# Patient Record
Sex: Female | Born: 1995 | Race: White | Hispanic: No | Marital: Married | Smoking: Never smoker
Health system: Southern US, Community
[De-identification: ages and names within clinical notes are randomized; demographics above are authoritative.]

## PROBLEM LIST (undated history)

## (undated) DIAGNOSIS — Z789 Other specified health status: Secondary | ICD-10-CM

## (undated) HISTORY — PX: OTHER SURGICAL HISTORY: SHX169

## (undated) HISTORY — DX: Other specified health status: Z78.9

## (undated) HISTORY — PX: FOOT SURGERY: SHX648

---
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
## 2019-11-01 LAB — OB RESULTS CONSOLE RUBELLA ANTIBODY, IGM: Rubella: IMMUNE

## 2019-11-01 LAB — OB RESULTS CONSOLE RPR: RPR: NONREACTIVE

## 2019-11-01 LAB — OB RESULTS CONSOLE HEPATITIS B SURFACE ANTIGEN: Hepatitis B Surface Ag: NEGATIVE

## 2019-11-02 LAB — OB RESULTS CONSOLE GC/CHLAMYDIA
Chlamydia: NEGATIVE
Gonorrhea: NEGATIVE

## 2020-01-26 LAB — OB RESULTS CONSOLE HIV ANTIBODY (ROUTINE TESTING): HIV: NONREACTIVE

## 2020-03-31 NOTE — L&D Delivery Note (Signed)
Delivery Note Labor onset: 05/09/2020  Labor Onset Time: 1100 Complete dilation at 8:56 PM  Onset of pushing at 2110 FHR second stage Cat 1 Analgesia/Anesthesia intrapartum: Epidural  Guided pushing with maternal urge. Delivery of a viable female at 2155. Fetal head delivered in ROA position.  Nuchal cord: None.  Infant placed on maternal abd, dried, and tactile stim.  Cord double clamped after pulsation stopped and cut by Sam, spouse.  Sam present for birth.  Cord blood sample collected: Yes Arterial cord blood sample collected: No  Placenta delivered Tomasa Blase, intact, with 3 VC.  Placenta to pathology. Uterine tone firm, bleeding small, no clots  2nd degree laceration identified.  Anesthesia: Epidural Repair: 4-0 Vicryl, 2-0 Vicryl EBL (mL): 275 Complications: none APGAR: APGAR (1 MIN): 8   APGAR (5 MINS): 9   APGAR (10 MINS):   Mom to postpartum.  Baby to Couplet care / Skin to Skin.  Roma Schanz MSN, [name] 05/09/2020, 10:41 PM

## 2020-04-03 LAB — OB RESULTS CONSOLE GBS: GBS: POSITIVE

## 2020-05-02 ENCOUNTER — Other Ambulatory Visit: Payer: Self-pay | Admitting: Obstetrics and Gynecology

## 2020-05-03 ENCOUNTER — Encounter (HOSPITAL_COMMUNITY): Payer: Self-pay | Admitting: *Deleted

## 2020-05-03 ENCOUNTER — Telehealth (HOSPITAL_COMMUNITY): Payer: Self-pay | Admitting: *Deleted

## 2020-05-03 NOTE — Telephone Encounter (Signed)
Preadmission screen  

## 2020-05-07 ENCOUNTER — Other Ambulatory Visit (HOSPITAL_COMMUNITY): Payer: 59

## 2020-05-09 ENCOUNTER — Inpatient Hospital Stay (HOSPITAL_COMMUNITY): Payer: 59 | Admitting: Anesthesiology

## 2020-05-09 ENCOUNTER — Encounter (HOSPITAL_COMMUNITY): Payer: Self-pay | Admitting: Obstetrics and Gynecology

## 2020-05-09 ENCOUNTER — Inpatient Hospital Stay (HOSPITAL_COMMUNITY): Payer: 59

## 2020-05-09 ENCOUNTER — Inpatient Hospital Stay (HOSPITAL_COMMUNITY)
Admission: AD | Admit: 2020-05-09 | Discharge: 2020-05-11 | DRG: 807 | Disposition: A | Discharge status: Home / Self Care | Payer: 59 | Attending: Obstetrics & Gynecology | Admitting: Obstetrics & Gynecology

## 2020-05-09 DIAGNOSIS — O99824 Streptococcus B carrier state complicating childbirth: Secondary | ICD-10-CM | POA: Diagnosis present

## 2020-05-09 DIAGNOSIS — Z3A41 41 weeks gestation of pregnancy: Secondary | ICD-10-CM

## 2020-05-09 DIAGNOSIS — Z20822 Contact with and (suspected) exposure to covid-19: Secondary | ICD-10-CM | POA: Diagnosis present

## 2020-05-09 DIAGNOSIS — Z3490 Encounter for supervision of normal pregnancy, unspecified, unspecified trimester: Secondary | ICD-10-CM | POA: Diagnosis present

## 2020-05-09 DIAGNOSIS — O480 Post-term pregnancy: Principal | ICD-10-CM | POA: Diagnosis present

## 2020-05-09 LAB — CBC
HCT: 34.1 % — ABNORMAL LOW (ref 36.0–46.0)
Hemoglobin: 11.2 g/dL — ABNORMAL LOW (ref 12.0–15.0)
MCH: 26.2 pg (ref 26.0–34.0)
MCHC: 32.8 g/dL (ref 30.0–36.0)
MCV: 79.9 fL — ABNORMAL LOW (ref 80.0–100.0)
Platelets: 304 10*3/uL (ref 150–400)
RBC: 4.27 MIL/uL (ref 3.87–5.11)
RDW: 14.9 % (ref 11.5–15.5)
WBC: 10.1 10*3/uL (ref 4.0–10.5)
nRBC: 0 % (ref 0.0–0.2)

## 2020-05-09 LAB — TYPE AND SCREEN
ABO/RH(D): A POS
Antibody Screen: NEGATIVE

## 2020-05-09 LAB — RESP PANEL BY RT-PCR (FLU A&B, COVID) ARPGX2
Influenza A by PCR: NEGATIVE
Influenza B by PCR: NEGATIVE
SARS Coronavirus 2 by RT PCR: NEGATIVE

## 2020-05-09 LAB — RPR: RPR Ser Ql: NONREACTIVE

## 2020-05-09 MED ORDER — SOD CITRATE-CITRIC ACID 500-334 MG/5ML PO SOLN: 30.0000 mL | ORAL | Status: DC | PRN

## 2020-05-09 MED ORDER — MISOPROSTOL 25 MCG QUARTER TABLET
25.0000 ug | ORAL_TABLET | ORAL | Status: DC | PRN
  Administered 2020-05-09 (×3): 25 ug via VAGINAL
  Filled 2020-05-09 (×3): qty 1

## 2020-05-09 MED ORDER — EPHEDRINE 5 MG/ML INJ: 10.0000 mg | INTRAVENOUS | Status: DC | PRN

## 2020-05-09 MED ORDER — PHENYLEPHRINE 40 MCG/ML (10ML) SYRINGE FOR IV PUSH (FOR BLOOD PRESSURE SUPPORT): 80.0000 ug | PREFILLED_SYRINGE | INTRAVENOUS | Status: DC | PRN

## 2020-05-09 MED ORDER — FENTANYL-BUPIVACAINE-NACL 0.5-0.125-0.9 MG/250ML-% EP SOLN
12.0000 mL/h | EPIDURAL | Status: DC | PRN
Start: 2020-05-09 — End: 2020-05-09
  Administered 2020-05-09 (×2): 12 mL/h via EPIDURAL
  Filled 2020-05-09: qty 250

## 2020-05-09 MED ORDER — PENICILLIN G POT IN DEXTROSE 60000 UNIT/ML IV SOLN
3.0000 10*6.[IU] | INTRAVENOUS | Status: DC
  Administered 2020-05-09 (×4): 3 10*6.[IU] via INTRAVENOUS
  Filled 2020-05-09 (×4): qty 50

## 2020-05-09 MED ORDER — OXYTOCIN-SODIUM CHLORIDE 30-0.9 UT/500ML-% IV SOLN
2.5000 [IU]/h | INTRAVENOUS | Status: DC
  Administered 2020-05-09: 2.5 [IU]/h via INTRAVENOUS
  Filled 2020-05-09: qty 500

## 2020-05-09 MED ORDER — TERBUTALINE SULFATE 1 MG/ML IJ SOLN: 0.2500 mg | Freq: Once | INTRAMUSCULAR | Status: DC | PRN

## 2020-05-09 MED ORDER — BUPIVACAINE HCL (PF) 0.25 % IJ SOLN
INTRAMUSCULAR | Status: DC | PRN
  Administered 2020-05-09: 10 mL via EPIDURAL

## 2020-05-09 MED ORDER — DIPHENHYDRAMINE HCL 50 MG/ML IJ SOLN: 12.5000 mg | INTRAMUSCULAR | Status: DC | PRN

## 2020-05-09 MED ORDER — ONDANSETRON HCL 4 MG/2ML IJ SOLN: 4.0000 mg | Freq: Four times a day (QID) | INTRAMUSCULAR | Status: DC | PRN

## 2020-05-09 MED ORDER — OXYTOCIN BOLUS FROM INFUSION
333.0000 mL | Freq: Once | INTRAVENOUS | Status: AC
  Administered 2020-05-09: 333 mL via INTRAVENOUS

## 2020-05-09 MED ORDER — LIDOCAINE HCL (PF) 1 % IJ SOLN: 30.0000 mL | INTRAMUSCULAR | Status: DC | PRN

## 2020-05-09 MED ORDER — ACETAMINOPHEN 325 MG PO TABS: 650.0000 mg | ORAL_TABLET | ORAL | Status: DC | PRN

## 2020-05-09 MED ORDER — SODIUM CHLORIDE 0.9 % IV SOLN
5.0000 10*6.[IU] | Freq: Once | INTRAVENOUS | Status: AC
  Administered 2020-05-09: 5 10*6.[IU] via INTRAVENOUS
  Filled 2020-05-09: qty 5

## 2020-05-09 MED ORDER — LACTATED RINGERS IV SOLN: INTRAVENOUS | Status: DC

## 2020-05-09 MED ORDER — LACTATED RINGERS IV SOLN: 500.0000 mL | INTRAVENOUS | Status: DC | PRN

## 2020-05-09 MED ORDER — LACTATED RINGERS IV SOLN: 500.0000 mL | Freq: Once | INTRAVENOUS | Status: DC

## 2020-05-09 MED ORDER — FENTANYL CITRATE (PF) 100 MCG/2ML IJ SOLN
50.0000 ug | INTRAMUSCULAR | Status: DC | PRN
  Administered 2020-05-09: 100 ug via INTRAVENOUS
  Filled 2020-05-09: qty 2

## 2020-05-09 MED ORDER — LIDOCAINE HCL (PF) 1 % IJ SOLN
INTRAMUSCULAR | Status: DC | PRN
  Administered 2020-05-09: 11 mL via EPIDURAL

## 2020-05-09 NOTE — Progress Notes (Signed)
Subjective:    Reviewed expectations of induction and answered questions.   Objective:    VS: BP 127/89   Pulse 90   Temp 98 F (36.7 C) (Oral)   Resp 16   Ht 5\' 9"  (1.753 m)   Wt 98 kg   BMI 31.90 kg/m  FHR : baseline 135 / variability moderate / accelerations present / absent decelerations Toco: contractions every 3-5 minutes  Membranes: intact Dilation: 1 Effacement (%): 60,70 Station: -3 Presentation: Vertex Exam by:: [email or web address], [name]  Assessment/Plan:   25 y.o. G1P0 [redacted]w[redacted]d  Labor: Cytotec x2, will repeat @ 0915 if needed Fetal Wellbeing:  Category I Pain Control:  plans epidural I/D:  GBS pos PCN x2 doses Anticipated MOD:  NSVD  [name], [name] 05/09/2020 8:14 AM

## 2020-05-09 NOTE — H&P (Signed)
OB ADMISSION/ HISTORY & PHYSICAL:  Admission Date: 05/09/2020 12:19 AM  Admit Diagnosis: Induction of labor for postdates @ 41 wks  Stephanie Cooley is a 25 y.o. female G1P0 [redacted]w[redacted]d presenting for IOL for postdates. Endorses active FM, denies, ctx, LOF and vaginal bleeding.   History of current pregnancy: G1P0   Primary OB Provider: [facility] Patient entered care with [facility] at 13.6 wks.   EDC 05/02/20 by LMP and congruent w/ 13.6 wk U/S.   Anatomy scan: 21 wks, complete w/ anterior placenta.   Last evaluation: 40.6 wks  Significant prenatal events:  Patient Active Problem List   Diagnosis Date Noted  . Post-dates pregnancy 05/09/2020  . Encounter for induction of labor 05/09/2020    Prenatal Labs: ABO, Rh: A POS Antibody: Negative Rubella: Immune (08/03 0000)  RPR:   NR HBsAg: Negative (08/03 0000)  HIV: Non-reactive (10/28 0000)  GTT: 88 GBS: Positive/-- (01/04 0000)  GC/CHL: Negative/Negative Genetics: Panorama low risk female Tdap/influenza vaccines: UPT/Declined   OB History  Gravida Para Term Preterm AB Living  1            SAB IAB Ectopic Multiple Live Births               # Outcome Date GA Lbr Len/2nd Weight Sex Delivery Anes PTL Lv  1 Current             Medical / Surgical History: Past medical history:  Past Medical History:  Diagnosis Date  . Medical history non-contributory     Past surgical history:  Past Surgical History:  Procedure Laterality Date  . FOOT SURGERY    . widsom teeth     Family History: No family history on file.  Social History:  reports that she has never smoked. She has never used smokeless tobacco. She reports previous alcohol use. She reports that she does not use drugs.  Allergies: Patient has no known allergies.   Current Medications at time of admission:  Prior to Admission medications   Not on File    Review of Systems: Constitutional: Negative   HENT: Negative   Eyes: Negative   Respiratory: Negative    Cardiovascular: Negative   Gastrointestinal: Negative  Genitourinary: negativefor bloody show, negative for LOF   Musculoskeletal: Negative   Skin: Negative   Neurological: Negative   Endo/Heme/Allergies: Negative   Psychiatric/Behavioral: Negative    Physical Exam: VS: Blood pressure 140/77, pulse 100, resp. rate 16, height 5\' 9"  (1.753 m), weight (!) 2.205 kg. AAO x3, no signs of distress Cardiovascular: RRR Respiratory: Lung fields clear to ausculation GU/GI: Abdomen gravid, non-tender, non-distended, active FM, vertex,  Extremities: negative edema, negative for pain, tenderness, and cords  Cervical exam:Dilation: 1 Effacement (%): 50,60 Station: -3 Exam by:: [email or web address], RN FHR: baseline rate 135 / variability moderate / accelerations present / absent decelerations TOCO: occasional   Prenatal Transfer Tool  Maternal Diabetes: No Genetic Screening: Normal Maternal Ultrasounds/Referrals: Normal Fetal Ultrasounds or other Referrals:  None Maternal Substance Abuse:  No Significant Maternal Medications:  None Significant Maternal Lab Results: Group B Strep positive    Assessment: 25 y.o. G1P0 [redacted]w[redacted]d IOL for postdates.   Cytotec x 1  FHR category 1 GBS positive Pain management plan: epidural PRN   Plan:  Admit to L&D Routine admission orders Epidural PRN  Will  Notified Dr [redacted]w[redacted]d  of admission and plan of care  Normand Sloop MSN, [name] 05/09/2020 1:37 AM

## 2020-05-09 NOTE — Anesthesia Preprocedure Evaluation (Signed)
Anesthesia Evaluation  Patient identified by MRN, date of birth, ID band Patient awake    Reviewed: Allergy & Precautions, H&P , NPO status , Patient's Chart, lab work & pertinent test results  Airway Mallampati: II  TM Distance: >3 FB Neck ROM: Full    Dental no notable dental hx.    Pulmonary neg pulmonary ROS,    Pulmonary exam normal breath sounds clear to auscultation       Cardiovascular negative cardio ROS Normal cardiovascular exam Rhythm:Regular Rate:Normal     Neuro/Psych negative neurological ROS  negative psych ROS   GI/Hepatic negative GI ROS, Neg liver ROS,   Endo/Other  negative endocrine ROS  Renal/GU negative Renal ROS  negative genitourinary   Musculoskeletal negative musculoskeletal ROS (+)   Abdominal   Peds negative pediatric ROS (+)  Hematology negative hematology ROS (+)   Anesthesia Other Findings   Reproductive/Obstetrics (+) Pregnancy                             Anesthesia Physical Anesthesia Plan  ASA: II  Anesthesia Plan: Epidural   Post-op Pain Management:    Induction:   PONV Risk Score and Plan:   Airway Management Planned:   Additional Equipment:   Intra-op Plan:   Post-operative Plan:   Informed Consent:   Plan Discussed with:   Anesthesia Plan Comments:         Anesthesia Quick Evaluation  

## 2020-05-09 NOTE — Progress Notes (Signed)
Nicloe Cooley is a 25 y.o. G1P0 at [redacted]w[redacted]d IOL for postdates.  Subjective:  Stephanie Cooley is resting comfortably with husband at bedside. SVE 1/60/-2. CAT 1. Cytotec placed PV.   Objective: Vitals:   05/09/20 0022 05/09/20 0032 05/09/20 0200 05/09/20 0250  BP:  140/77 115/61 127/73  Pulse:  100 80 86  Resp:  16    Temp:  98 F (36.7 C)    TempSrc:  Oral    Weight: 98 kg     Height: 5\' 9"  (1.753 m)       No intake/output data recorded.    FHT:  FHR: 145 bpm, variability: moderate,  accelerations:  Present,  decelerations:  Absent UC:   occasional SVE:   1/60-70/-2  Labs:   Recent Labs    05/09/20 0100  WBC 10.1  HGB 11.2*  HCT 34.1*  PLT 304    Assessment / Plan: [name]@ [redacted]w[redacted]d IOL for postdates  Labor: Progressing normally, cytotec x 2 Preeclampsia:  no signs or symptoms of toxicity Fetal Wellbeing:  Category I Pain Control:  Epidural prn I/D:  GBS positive - PCN  Anticipated MOD:  NSVD   [name], [name] 05/09/2020, 5:27 AM

## 2020-05-09 NOTE — Progress Notes (Signed)
Subjective:    Ctx increased in frequency and pain after Cytotec #3. IV sedation not effective for long term pain management. Now comfortable after epidural.   Objective:    VS: BP 108/61   Pulse 82   Temp 98.2 F (36.8 C) (Oral)   Resp 18   Ht 5\' 9"  (1.753 m)   Wt 98 kg   BMI 31.90 kg/m  FHR : baseline 130 / variability moderate / accelerations present / absent decelerations Toco: contractions every 2 minutes  Membranes: intact Dilation: 3 Effacement (%): 80 Cervical Position: Posterior Station: -2 Presentation: Vertex Exam by:: [place], rn Pitocin none  Assessment/Plan:   25 y.o. G1P0 [redacted]w[redacted]d  Labor: Progressing normally and began to labor after 3rd cytotec, will consider AROM wi decent of vertex. May augment w/ Pitocin if needed Preeclampsia:  N/A Fetal Wellbeing:  Category I Pain Control:  Epidural I/D:  GBS pos, adequate treatment w/ PCN Anticipated MOD:  NSVD  [name], [name] 05/09/2020 2:14 PM

## 2020-05-09 NOTE — Lactation Note (Signed)
This note was copied from a baby's chart. Lactation Consultation Note  Patient Name: Girl Jenie Parish [id number] Date: 05/09/2020 Reason for consult: L&D Initial assessment Age:26 hours  L&D consult with 1 hour old infant and P1 mother. Parents are present at time of consult. Congratulated them on their newborn. Infant is skin to skin prone on father's chest. Discussed STS as ideal transition for infants after birth helping with temperature, blood sugar and comfort. Talked about primal reflexes such as rooting, hands to mouth, searching for the breast among others.   LC assisted with latch and hand expression at this time. Explained LC services availability during postpartum stay. Thanked family for their time.   Maternal Data Has patient been taught Hand Expression?: Yes Does the patient have breastfeeding experience prior to this delivery?: No  Feeding Mother's Current Feeding Choice: Breast Milk and Formula  LATCH Score Latch: Repeated attempts needed to sustain latch, nipple held in mouth throughout feeding, stimulation needed to elicit sucking reflex.  Audible Swallowing: A few with stimulation  Type of Nipple: Everted at rest and after stimulation (short shaft)  Comfort (Breast/Nipple): Soft / non-tender  Hold (Positioning): Assistance needed to correctly position infant at breast and maintain latch.  LATCH Score: 7   Lactation Tools Discussed/Used    Interventions Interventions: Assisted with latch;Skin to skin;Breast feeding basics reviewed  Discharge    Consult Status Consult Status: Follow-up Date: 05/09/20 Follow-up type: In-patient    Linels A Higuera Ancidey 05/09/2020, 10:56 PM

## 2020-05-09 NOTE — Plan of Care (Signed)

## 2020-05-09 NOTE — Anesthesia Procedure Notes (Signed)
Epidural Patient location during procedure: OB Start time: 05/09/2020 1:13 PM End time: 05/09/2020 1:22 PM  Staffing Anesthesiologist: Lowella Curb, MD Performed: anesthesiologist   Preanesthetic Checklist Completed: patient identified, IV checked, site marked, risks and benefits discussed, surgical consent, monitors and equipment checked, pre-op evaluation and timeout performed  Epidural Patient position: sitting Prep: ChloraPrep Patient monitoring: heart rate, cardiac monitor, continuous pulse ox and blood pressure Approach: midline Location: L2-L3 Injection technique: LOR saline  Needle:  Needle type: Tuohy  Needle gauge: 17 G Needle length: 9 cm Needle insertion depth: 5 cm Catheter type: closed end flexible Catheter size: 20 Guage Catheter at skin depth: 9 cm Test dose: negative  Assessment Events: blood not aspirated, injection not painful, no injection resistance, no paresthesia and negative IV test  Additional Notes Epidural placed by SRNA under direct supervisionReason for block:procedure for pain

## 2020-05-10 LAB — CBC
HCT: 32.9 % — ABNORMAL LOW (ref 36.0–46.0)
Hemoglobin: 10.2 g/dL — ABNORMAL LOW (ref 12.0–15.0)
MCH: 25.3 pg — ABNORMAL LOW (ref 26.0–34.0)
MCHC: 31 g/dL (ref 30.0–36.0)
MCV: 81.6 fL (ref 80.0–100.0)
Platelets: 265 10*3/uL (ref 150–400)
RBC: 4.03 MIL/uL (ref 3.87–5.11)
RDW: 15 % (ref 11.5–15.5)
WBC: 15.2 10*3/uL — ABNORMAL HIGH (ref 4.0–10.5)
nRBC: 0 % (ref 0.0–0.2)

## 2020-05-10 MED ORDER — BENZOCAINE-MENTHOL 20-0.5 % EX AERO
1.0000 "application " | INHALATION_SPRAY | CUTANEOUS | Status: DC | PRN
  Administered 2020-05-10: 1 via TOPICAL
  Filled 2020-05-10 (×2): qty 56

## 2020-05-10 MED ORDER — DIBUCAINE (PERIANAL) 1 % EX OINT: 1.0000 "application " | TOPICAL_OINTMENT | CUTANEOUS | Status: DC | PRN

## 2020-05-10 MED ORDER — ONDANSETRON HCL 4 MG PO TABS: 4.0000 mg | ORAL_TABLET | ORAL | Status: DC | PRN

## 2020-05-10 MED ORDER — PRENATAL MULTIVITAMIN CH
1.0000 | ORAL_TABLET | Freq: Every day | ORAL | Status: DC
  Administered 2020-05-10 – 2020-05-11 (×2): 1 via ORAL
  Filled 2020-05-10 (×2): qty 1

## 2020-05-10 MED ORDER — SENNOSIDES-DOCUSATE SODIUM 8.6-50 MG PO TABS
2.0000 | ORAL_TABLET | ORAL | Status: DC
  Administered 2020-05-10 (×2): 2 via ORAL
  Filled 2020-05-10 (×2): qty 2

## 2020-05-10 MED ORDER — WITCH HAZEL-GLYCERIN EX PADS
1.0000 | MEDICATED_PAD | CUTANEOUS | Status: DC | PRN
Start: 2020-05-09 — End: 2020-05-11

## 2020-05-10 MED ORDER — FAMOTIDINE 20 MG PO TABS: 20.0000 mg | ORAL_TABLET | Freq: Two times a day (BID) | ORAL | Status: DC | PRN

## 2020-05-10 MED ORDER — SIMETHICONE 80 MG PO CHEW: 80.0000 mg | CHEWABLE_TABLET | ORAL | Status: DC | PRN

## 2020-05-10 MED ORDER — ONDANSETRON HCL 4 MG/2ML IJ SOLN: 4.0000 mg | INTRAMUSCULAR | Status: DC | PRN

## 2020-05-10 MED ORDER — COCONUT OIL OIL
1.0000 "application " | TOPICAL_OIL | Status: DC | PRN
  Administered 2020-05-10: 1 via TOPICAL

## 2020-05-10 MED ORDER — TETANUS-DIPHTH-ACELL PERTUSSIS 5-2.5-18.5 LF-MCG/0.5 IM SUSY: 0.5000 mL | PREFILLED_SYRINGE | Freq: Once | INTRAMUSCULAR | Status: DC

## 2020-05-10 MED ORDER — DIPHENHYDRAMINE HCL 25 MG PO CAPS: 25.0000 mg | ORAL_CAPSULE | Freq: Four times a day (QID) | ORAL | Status: DC | PRN

## 2020-05-10 MED ORDER — ACETAMINOPHEN 325 MG PO TABS
650.0000 mg | ORAL_TABLET | ORAL | Status: DC | PRN
  Administered 2020-05-10 – 2020-05-11 (×2): 650 mg via ORAL
  Filled 2020-05-10 (×2): qty 2

## 2020-05-10 MED ORDER — IBUPROFEN 600 MG PO TABS
600.0000 mg | ORAL_TABLET | Freq: Four times a day (QID) | ORAL | Status: DC
  Administered 2020-05-10 – 2020-05-11 (×6): 600 mg via ORAL
  Filled 2020-05-10 (×6): qty 1

## 2020-05-10 NOTE — Lactation Note (Signed)
This note was copied from a baby's chart. Lactation Consultation Note  Patient Name: Stephanie Cooley [id number] Date: 05/10/2020 Reason for consult: Initial assessment;Mother's request;Primapara;1st time breastfeeding;Term;Difficult latch  Age:26 hours  Mom using a NS size 16 last feeding for 10 minutes. LC placed infant STS try to latch but unsuccessful. Mom flat short nipples. LC used 16 NS and infant will latch and suck intermittently.   LC reached out to RN, Wendall Papa, complete a DBM consent and use DM via curve tip syringe to fill 16 NS.  LC returned and per RN infant able to take 8 ml of DBM via curve tip and NS.  LC set up the DEBP.   Plan 1. To feed based on cues 8-12 x in 24 hour period no more than 4 hours without an attempt. Mom to place infant STS and look for signs of milk transfer.          2. Mom to try latch breast first then if needed use 16 NS. Mom knows to call RN or [facility] for assistance.          3. Mom to supplement in curve tip with DBM if needed to initiate a latch.          4. DEBP q 3 hours for 15 minutes.           5. Breastfeeding supplementation guide provided to offer EBM or DBM based on age.   All questions answered at the end of the visit.   Maternal Data Has patient been taught Hand Expression?: Yes Does the patient have breastfeeding experience prior to this delivery?: No  Feeding Mother's Current Feeding Choice: Breast Milk and Donor Milk  LATCH Score Latch: Repeated attempts needed to sustain latch, nipple held in mouth throughout feeding, stimulation needed to elicit sucking reflex.  Audible Swallowing: None  Type of Nipple: Flat (short shafted and small)  Comfort (Breast/Nipple): Soft / non-tender  Hold (Positioning): Assistance needed to correctly position infant at breast and maintain latch.  LATCH Score: 5   Lactation Tools Discussed/Used Tools: Nipple Shields Nipple shield size: 16 Flange Size: 24 Breast pump type:  Double-Electric Breast Pump Pump Education: Setup, frequency, and cleaning;Milk Storage Reason for Pumping: Increase stimulation Pumping frequency: every 3 hours for 15 minutes  Interventions Interventions: Breast feeding basics reviewed;Support pillows;Education;Assisted with latch;Position options;Skin to skin;Expressed milk;Breast massage;Hand express;DEBP;Adjust position;Reverse pressure  Discharge WIC Program: No  Consult Status Consult Status: Follow-up Date: 05/11/20 Follow-up type: In-patient    Stephanie Cooley  Stephanie Cooley 05/10/2020, 1:09 PM

## 2020-05-10 NOTE — Progress Notes (Signed)
Subjective: Postpartum Day # 1 : S/P NSVD due to presented on 2-9 for late term IOL, progressed with Cytotec for induction, with SVD on 2/9 @ 2155, ebl over 2nd degree repaired laceration with hgb drop of 11.2-10.2, had baby female. Patient up ad lib, denies syncope or dizziness. Reports consuming regular diet without issues and denies N/V. Patient reports 0 bowel movement + passing flatus.  Denies issues with urination and reports bleeding is "lighter."  Patient is breastfeeding and reports going well.  Desires pill for postpartum contraception.  Pain is being appropriately managed with use of po meds.   2ns laceration repaired Feeding:  breast Contraceptive plan:  pill Bany Female  Objective: Vital signs in last 24 hours: Patient Vitals for the past 24 hrs:  BP Temp Temp src Pulse Resp SpO2  05/10/20 0525 119/78 98.4 F (36.9 C) - 92 18 98 %  05/10/20 0130 126/85 98.5 F (36.9 C) Axillary 91 17 99 %  05/10/20 0015 128/76 98.3 F (36.8 C) Oral 87 18 99 %  05/09/20 2315 123/74 - - 90 - -  05/09/20 2300 123/75 - - 97 - -  05/09/20 2245 116/67 - - 86 - -  05/09/20 2231 (!) 110/96 - - (!) 131 - -  05/09/20 2216 123/67 - - 96 - -  05/09/20 2100 130/72 - - (!) 104 16 -  05/09/20 1928 - 99.1 F (37.3 C) Oral - 16 -  05/09/20 1906 125/83 - - 94 18 -  05/09/20 1830 126/90 - - 98 18 -  05/09/20 1801 124/78 98.1 F (36.7 C) Oral 83 18 -  05/09/20 1731 (!) 105/58 - - 68 18 -  05/09/20 1701 105/63 - - 79 18 -  05/09/20 1631 (!) 106/53 - - 76 - -  05/09/20 1601 117/63 - - 72 18 -  05/09/20 1531 123/76 - - 72 18 -  05/09/20 1502 119/88 - - 83 18 -  05/09/20 1415 106/66 - - 82 18 -  05/09/20 1411 108/61 - - 82 18 -  05/09/20 1407 106/70 - - 84 - -  05/09/20 1400 104/65 - - 86 - -  05/09/20 1356 107/67 - - (!) 180 - -  05/09/20 1351 103/68 - - 88 - -  05/09/20 1346 104/85 - - 98 - -  05/09/20 1340 108/62 - - 84 - -  05/09/20 1336 103/68 - - 85 - -  05/09/20 1330 111/75 - - 86 - -   05/09/20 1325 111/74 - - 78 - -  05/09/20 1324 112/72 - - 89 - -  05/09/20 1321 (!) 86/44 - - 97 - -  05/09/20 1317 114/69 - - 93 - -  05/09/20 1315 114/74 - - 97 - -  05/09/20 1219 - - - - 18 -  05/09/20 1149 111/69 98.2 F (36.8 C) Oral 79 16 -  05/09/20 1035 - - - - 18 -  05/09/20 0755 127/89 98 F (36.7 C) Oral 90 16 -     Physical Exam:  General: alert, cooperative, appears stated age and no distress Mood/Affect: happy Lungs: clear to auscultation, no wheezes, rales or rhonchi, symmetric air entry.  Heart: normal rate, regular rhythm, normal S1, S2, no murmurs, rubs, clicks or gallops. Breast: breasts appear normal, no suspicious masses, no skin or nipple changes or axillary nodes. Abdomen:  + bowel sounds, soft, non-tender GU: perineum aproximate, healing well. No signs of external hematomas.  Uterine Fundus: firm Lochia: appropriate Skin: Warm, Dry.  DVT Evaluation: No evidence of DVT seen on physical exam. Negative Homan's sign. No cords or calf tenderness. No significant calf/ankle edema.  CBC Latest Ref Rng & Units 05/10/2020 05/09/2020 07/22/2008  WBC 4.0 - 10.5 K/uL 15.2(H) 10.1 -  Hemoglobin 12.0 - 15.0 g/dL 10.2(L) 11.2(L) 13.6  Hematocrit 36.0 - 46.0 % 32.9(L) 34.1(L) 40.0  Platelets 150 - 400 K/uL 265 304 -    Results for orders placed or performed during the hospital encounter of 05/09/20 (from the past 24 hour(s))  CBC     Status: Abnormal   Collection Time: 05/10/20  5:13 AM  Result Value Ref Range   WBC 15.2 (H) 4.0 - 10.5 K/uL   RBC 4.03 3.87 - 5.11 MIL/uL   Hemoglobin 10.2 (L) 12.0 - 15.0 g/dL   HCT 82.9 (L) 56.2 - 13.0 %   MCV 81.6 80.0 - 100.0 fL   MCH 25.3 (L) 26.0 - 34.0 pg   MCHC 31.0 30.0 - 36.0 g/dL   RDW 86.5 78.4 - 69.6 %   Platelets 265 150 - 400 K/uL   nRBC 0.0 0.0 - 0.2 %     CBG (last 3)  No results for input(s): GLUCAP in the last 72 hours.   I/O last 3 completed shifts: In: -  Out: 2875 [Urine:2600; Blood:275]    Assessment Postpartum Day # 1 : S/P NSVD due to presented on 2-9 for late term IOL, progressed with Cytotec for induction, with SVD on 2/9 @ 2155, ebl over 2nd degree repaired laceration with hgb drop of 11.2-10.2, had baby female. Pt stable. -1 involution. breastfeeding. Hemodynamically stable.   Plan: Continue other mgmt as ordered VTE prophylactics: Early ambulated as tolerates.  Pain control: Motrin/Tylenol PRN Education given regarding options for contraception, including barrier methods, injectable contraception, IUD placement, oral contraceptives.  Plan for discharge tomorrow, Breastfeeding and Lactation consult   Dr. Mora Appl to be updated on patient status  [facility] NP-C, [name] 05/10/2020, 7:02 AM

## 2020-05-10 NOTE — Anesthesia Postprocedure Evaluation (Signed)
Anesthesia Post Note  Patient: Stephanie Cooley  Procedure(s) Performed: AN AD HOC LABOR EPIDURAL     Patient location during evaluation: Mother Baby Anesthesia Type: Epidural Level of consciousness: awake and alert Pain management: pain level controlled Vital Signs Assessment: post-procedure vital signs reviewed and stable Respiratory status: spontaneous breathing, nonlabored ventilation and respiratory function stable Cardiovascular status: stable Postop Assessment: no headache, no backache and epidural receding Anesthetic complications: no   No complications documented.  Last Vitals:  Vitals:   05/10/20 0525 05/10/20 0932  BP: 119/78 131/75  Pulse: 92 62  Resp: 18 17  Temp: 36.9 C 36.8 C  SpO2: 98%     Last Pain:  Vitals:   05/10/20 0932  TempSrc: Oral  PainSc:    Pain Goal:                   Seini Lannom

## 2020-05-11 MED ORDER — IBUPROFEN 600 MG PO TABS: 600.0000 mg | ORAL_TABLET | Freq: Four times a day (QID) | ORAL | 0 refills | Status: DC

## 2020-05-11 MED ORDER — ACETAMINOPHEN 325 MG PO TABS: 650.0000 mg | ORAL_TABLET | ORAL | Status: DC | PRN

## 2020-05-11 NOTE — Lactation Note (Addendum)
This note was copied from a baby's chart. Lactation Consultation Note  Patient Name: Stephanie Cooley [id number] Date: 05/11/2020   Age:26 hours  When I entered room, infant was cueing to feed despite having recently fed. I offered to help parents with feeding & they accepted. They had been using a curved-tip syringe to supplement at the breast. I offered to try a 5 Fr./syringe and Mom agreed to try.  I noted that the size 16 nipple shield seemed a little tight after infant had suckled for a few minutes. I moved Mom to a size 24 nipple shield with good results (no size 20 nipple shields were available). Infant was supplemented at the breast with the 5 Fr/syringe. I showed parents how to know if infant is swallowing and how to pace the slow pushing of the plunger with the syringe.   Infant did well with supplementing at the breast & the size 24 NS (infant was fed & content in 10 minutes). Infant could have had a slightly deeper latch while using the size 24 NS so I showed parents the specifics of an asymmetric latch via a [employer] & I showed Mom how to do side-lying nursing (while Dad was holding infant).   I asked Mom to pump 4-6 times/day since she is using a nipple shield; size 21 flanges were provided for her Medela pump at home. I underscored the need to f/u with lactation on an outpatient basis since she is using a nipple shield. A referral was sent to our outpatient clinic.   A different hand expression technique was taught to Mom, which made Mom happy.  A slight divet was noted in tip of infant's tongue when tongue was lying flat over gumline. Good mobility of tongue noted when infant extended her tongue to the R.  Extra NS provided.  Dad was shown how to wash the 5Fr and syringe. An extra 5Fr & 2 extra syringes were provided. Parents have volume guidelines for supplementing at breast or with a bottle & know to supplement until infant is content.  I also spoke to  parents about how to supplement at the breast without a nipple shield (and ways Mom could get her nipple to become more erect before trying to feed at the bare breast).   Sanitizing once/day reviewed for pump parts.   Parents know how to reach [place] for any post-discharge questions.    Lurline Hare [facility] 05/11/2020, 9:18 AM

## 2020-05-11 NOTE — Discharge Summary (Signed)
SVD with 2 degree lac OB Discharge Summary  Patient Name: Stephanie Cooley DOB: [birth year] MRN: [id number]  Date of admission: 05/09/2020 Intrauterine pregnancy: [redacted]w[redacted]d   Admitting diagnosis: Post-dates pregnancy [O48.0] Encounter for induction of labor [Z34.90] Secondary diagnosis: None  Date of discharge: 05/11/2020    Discharge diagnosis: Term Pregnancy Delivered     Prenatal history: G1P1001   EDC : 05/02/2020, by Other Basis  Pren[facility] care at [facility]  Primary provider : [facility]  Prenatal Labs: ABO, Rh: --/--/A POS (02/09 3151) /  Antibody: NEG (02/09 0048) Rubella: Immune (08/03 0000)  /  RPR: NON REACTIVE (02/09 0100)  HBsAg: Negative (08/03 0000)  HIV: Non-reactive (10/28 0000)  GBS: Positive/-- (01/04 0000)                                    Hospital course:  Induction of labor With Vaginal Delivery      25 y.o. yo G1P1001 at [redacted]w[redacted]d was admitted for postdates IOL on 05/09/2020. Patient had an uncomplicated labor course as follows:  Membrane Rupture Time/Date: 9:19 PM ,05/09/2020   Delivery Method:Vaginal, Spontaneous  Episiotomy: None  Lacerations:  2nd degree  Patient had an uncomplicated postpartum course.  She is ambulating, tolerating a regular diet, passing flatus, and urinating well. Patient is discharged home in stable condition on 05/11/20.  Newborn Data: Birth date:05/09/2020  Birth time:9:55 PM  Gender:Female  Living status:Living  Apgars:8 ,9  Weight:3751 g  Delivering PROVIDER: Rhea Pink B                                                            Complications: None  Newborn Data: Live born female  Birth Weight: 8 lb 4.3 oz (3751 g) APGAR: 8, 9  Newborn Delivery   Birth date/time: 05/09/2020 21:55:00 Delivery type: Vaginal, Spontaneous      Baby Feeding: Breast Disposition:home with mother  Post partum procedures: N/A Desires the mini pill for contraception  Labs: Lab Results  Component Value Date   WBC 15.2 (H) 05/10/2020   HGB 10.2 (L)  05/10/2020   HCT 32.9 (L) 05/10/2020   MCV 81.6 05/10/2020   PLT 265 05/10/2020   CMP Latest Ref Rng & Units 07/22/2008  Glucose 70 - 99 mg/dL 87  BUN 6 - 23 mg/dL 15  Creatinine 0.4 - 1.2 mg/dL 0.8  Sodium 761 - 607 mEq/L 141  Potassium 3.5 - 5.1 mEq/L 3.1(L)  Chloride 96 - 112 mEq/L 106    Physical Exam @ time of discharge:  Vitals:   05/10/20 0932 05/10/20 1317 05/10/20 2111 05/11/20 0540  BP: 131/75 116/74 118/79 118/81  Pulse: 62 78 86 85  Resp: 17 18 18 18   Temp: 98.2 F (36.8 C) 97.6 F (36.4 C) 98.7 F (37.1 C) 98 F (36.7 C)  TempSrc: Oral Oral Oral Oral  SpO2:   98%   Weight:      Height:       general: alert, cooperative and no distress lochia: appropriate uterine fundus: firm perineum: intact incision: N/A Extremities:WNL DVT Evaluation: No evidence of DVT seen on physical exam. Negative Homan's sign. No cords or calf tenderness. No significant calf/ankle edema.  Discharge instructions:  "Baby and  Me Booklet" and [facility] Booklet Discharge Medications:  Allergies as of 05/11/2020   No Known Allergies     Medication List    STOP taking these medications   calcium carbonate 500 MG chewable tablet Commonly known as: TUMS - dosed in mg elemental calcium   famotidine-calcium carbonate-magnesium hydroxide 10-800-165 MG chewable tablet Commonly known as: PEPCID COMPLETE     TAKE these medications   acetaminophen 325 MG tablet Commonly known as: Tylenol Take 2 tablets (650 mg total) by mouth every 4 (four) hours as needed (for pain scale < 4). What changed:   when to take this  reasons to take this   ibuprofen 600 MG tablet Commonly known as: ADVIL Take 1 tablet (600 mg total) by mouth every 6 (six) hours.   prenatal multivitamin Tabs tablet Take 1 tablet by mouth at bedtime.      Diet: routine diet Activity: Advance as tolerated. Pelvic rest x 6 weeks.  Follow up:4 weeks  Signed: Carollee Leitz MSN, [name] 05/11/2020, 11:02 AM

## 2020-05-14 LAB — SURGICAL PATHOLOGY

## 2021-03-31 NOTE — L&D Delivery Note (Signed)
Delivery Note At 2:25 PM a viable female was delivered via Vaginal, Spontaneous (Presentation: Left Occiput Anterior).  APGAR: 8, 9; weight  .   Placenta status: Spontaneous, Intact.  Cord: 3 vessels with the following complications: None.  Cord pH: na  Anesthesia: Epidural Episiotomy: None Lacerations: 2nd degree;Perineal Suture Repair: 2.0 chromic Est. Blood Loss (mL): 150  Mom to postpartum.  Baby to Couplet care / Skin to Skin.  Parents asked if FOB could assist with delivery. I allowed him to assist.    Charron Coultas A Sylvan Sookdeo 12/22/2021, 3:26 PM

## 2021-05-13 DIAGNOSIS — N925 Other specified irregular menstruation: Secondary | ICD-10-CM | POA: Diagnosis not present

## 2021-05-13 DIAGNOSIS — Z3A01 Less than 8 weeks gestation of pregnancy: Secondary | ICD-10-CM | POA: Diagnosis not present

## 2021-05-30 DIAGNOSIS — Z3A10 10 weeks gestation of pregnancy: Secondary | ICD-10-CM | POA: Diagnosis not present

## 2021-05-30 DIAGNOSIS — Z3481 Encounter for supervision of other normal pregnancy, first trimester: Secondary | ICD-10-CM | POA: Diagnosis not present

## 2021-05-30 DIAGNOSIS — O3680X9 Pregnancy with inconclusive fetal viability, other fetus: Secondary | ICD-10-CM | POA: Diagnosis not present

## 2021-05-30 DIAGNOSIS — N925 Other specified irregular menstruation: Secondary | ICD-10-CM | POA: Diagnosis not present

## 2021-05-30 LAB — OB RESULTS CONSOLE RPR: RPR: NONREACTIVE

## 2021-05-30 LAB — HEPATITIS C ANTIBODY: HCV Ab: NEGATIVE

## 2021-05-30 LAB — OB RESULTS CONSOLE RUBELLA ANTIBODY, IGM: Rubella: IMMUNE

## 2021-05-30 LAB — OB RESULTS CONSOLE HIV ANTIBODY (ROUTINE TESTING): HIV: NONREACTIVE

## 2021-05-30 LAB — OB RESULTS CONSOLE ABO/RH: RH Type: POSITIVE

## 2021-05-30 LAB — OB RESULTS CONSOLE ANTIBODY SCREEN: Antibody Screen: NEGATIVE

## 2021-05-30 LAB — OB RESULTS CONSOLE GC/CHLAMYDIA
Chlamydia: NEGATIVE
Neisseria Gonorrhea: NEGATIVE

## 2021-05-30 LAB — OB RESULTS CONSOLE HEPATITIS B SURFACE ANTIGEN: Hepatitis B Surface Ag: NEGATIVE

## 2021-08-01 DIAGNOSIS — Z363 Encounter for antenatal screening for malformations: Secondary | ICD-10-CM | POA: Diagnosis not present

## 2021-08-01 DIAGNOSIS — Z3A20 20 weeks gestation of pregnancy: Secondary | ICD-10-CM | POA: Diagnosis not present

## 2021-09-09 ENCOUNTER — Inpatient Hospital Stay (HOSPITAL_COMMUNITY): Payer: 59

## 2021-09-09 ENCOUNTER — Other Ambulatory Visit: Payer: Self-pay

## 2021-09-09 ENCOUNTER — Observation Stay (HOSPITAL_COMMUNITY)
Admission: AD | Admit: 2021-09-09 | Discharge: 2021-09-11 | Disposition: A | Discharge status: Home / Self Care | Payer: 59 | Attending: Obstetrics & Gynecology | Admitting: Obstetrics & Gynecology

## 2021-09-09 ENCOUNTER — Encounter (HOSPITAL_COMMUNITY): Payer: Self-pay | Admitting: *Deleted

## 2021-09-09 DIAGNOSIS — O0992 Supervision of high risk pregnancy, unspecified, second trimester: Secondary | ICD-10-CM | POA: Insufficient documentation

## 2021-09-09 DIAGNOSIS — R1011 Right upper quadrant pain: Secondary | ICD-10-CM | POA: Diagnosis not present

## 2021-09-09 DIAGNOSIS — Z79899 Other long term (current) drug therapy: Secondary | ICD-10-CM | POA: Diagnosis not present

## 2021-09-09 DIAGNOSIS — R600 Localized edema: Secondary | ICD-10-CM | POA: Diagnosis not present

## 2021-09-09 DIAGNOSIS — K353 Acute appendicitis with localized peritonitis, without perforation or gangrene: Secondary | ICD-10-CM | POA: Diagnosis not present

## 2021-09-09 DIAGNOSIS — N3289 Other specified disorders of bladder: Secondary | ICD-10-CM | POA: Diagnosis not present

## 2021-09-09 DIAGNOSIS — K37 Unspecified appendicitis: Principal | ICD-10-CM | POA: Diagnosis present

## 2021-09-09 DIAGNOSIS — Z3A25 25 weeks gestation of pregnancy: Secondary | ICD-10-CM | POA: Diagnosis not present

## 2021-09-09 DIAGNOSIS — O99612 Diseases of the digestive system complicating pregnancy, second trimester: Secondary | ICD-10-CM | POA: Diagnosis present

## 2021-09-09 DIAGNOSIS — K3580 Unspecified acute appendicitis: Secondary | ICD-10-CM | POA: Diagnosis not present

## 2021-09-09 LAB — CBC WITH DIFFERENTIAL/PLATELET
Abs Immature Granulocytes: 0.1 10*3/uL — ABNORMAL HIGH (ref 0.00–0.07)
Basophils Absolute: 0 10*3/uL (ref 0.0–0.1)
Basophils Relative: 0 %
Eosinophils Absolute: 0 10*3/uL (ref 0.0–0.5)
Eosinophils Relative: 0 %
HCT: 41.9 % (ref 36.0–46.0)
Hemoglobin: 13.3 g/dL (ref 12.0–15.0)
Immature Granulocytes: 1 %
Lymphocytes Relative: 7 %
Lymphs Abs: 1.3 10*3/uL (ref 0.7–4.0)
MCH: 28.5 pg (ref 26.0–34.0)
MCHC: 31.7 g/dL (ref 30.0–36.0)
MCV: 89.9 fL (ref 80.0–100.0)
Monocytes Absolute: 0.6 10*3/uL (ref 0.1–1.0)
Monocytes Relative: 3 %
Neutro Abs: 16 10*3/uL — ABNORMAL HIGH (ref 1.7–7.7)
Neutrophils Relative %: 89 %
Platelets: 345 10*3/uL (ref 150–400)
RBC: 4.66 MIL/uL (ref 3.87–5.11)
RDW: 12.4 % (ref 11.5–15.5)
WBC: 18.1 10*3/uL — ABNORMAL HIGH (ref 4.0–10.5)
nRBC: 0 % (ref 0.0–0.2)

## 2021-09-09 LAB — URINALYSIS, ROUTINE W REFLEX MICROSCOPIC
Bilirubin Urine: NEGATIVE
Glucose, UA: NEGATIVE mg/dL
Hgb urine dipstick: NEGATIVE
Ketones, ur: 20 mg/dL — AB
Leukocytes,Ua: NEGATIVE
Nitrite: NEGATIVE
Protein, ur: NEGATIVE mg/dL
Specific Gravity, Urine: 1.024 (ref 1.005–1.030)
pH: 5 (ref 5.0–8.0)

## 2021-09-09 LAB — COMPREHENSIVE METABOLIC PANEL
ALT: 19 U/L (ref 0–44)
AST: 21 U/L (ref 15–41)
Albumin: 3.1 g/dL — ABNORMAL LOW (ref 3.5–5.0)
Alkaline Phosphatase: 90 U/L (ref 38–126)
Anion gap: 9 (ref 5–15)
BUN: 8 mg/dL (ref 6–20)
CO2: 22 mmol/L (ref 22–32)
Calcium: 8.7 mg/dL — ABNORMAL LOW (ref 8.9–10.3)
Chloride: 105 mmol/L (ref 98–111)
Creatinine, Ser: 0.65 mg/dL (ref 0.44–1.00)
GFR, Estimated: >60 mL/min (ref >60)
Glucose, Bld: 93 mg/dL (ref 70–99)
Potassium: 3.7 mmol/L (ref 3.5–5.1)
Sodium: 136 mmol/L (ref 135–145)
Total Bilirubin: 0.6 mg/dL (ref 0.3–1.2)
Total Protein: 6.9 g/dL (ref 6.5–8.1)

## 2021-09-09 LAB — LIPASE, BLOOD: Lipase: 27 U/L (ref 11–51)

## 2021-09-09 MED ORDER — CEFTRIAXONE SODIUM 2 G IJ SOLR
2.0000 g | INTRAMUSCULAR | Status: DC
  Administered 2021-09-09: 2 g via INTRAVENOUS
  Filled 2021-09-09 (×2): qty 20

## 2021-09-09 MED ORDER — HYDROMORPHONE HCL 1 MG/ML IJ SOLN: 1.0000 mg | INTRAMUSCULAR | Status: DC | PRN

## 2021-09-09 MED ORDER — MORPHINE SULFATE (PF) 4 MG/ML IV SOLN: 2.0000 mg | INTRAVENOUS | Status: DC | PRN

## 2021-09-09 MED ORDER — HYDROMORPHONE HCL 1 MG/ML IJ SOLN
1.0000 mg | Freq: Once | INTRAMUSCULAR | Status: AC
  Administered 2021-09-09: 1 mg via INTRAVENOUS
  Filled 2021-09-09: qty 1

## 2021-09-09 MED ORDER — ACETAMINOPHEN 500 MG PO TABS
1000.0000 mg | ORAL_TABLET | Freq: Four times a day (QID) | ORAL | Status: DC
  Administered 2021-09-09 – 2021-09-11 (×6): 1000 mg via ORAL
  Filled 2021-09-09 (×6): qty 2

## 2021-09-09 MED ORDER — METHOCARBAMOL 500 MG PO TABS
1000.0000 mg | ORAL_TABLET | Freq: Three times a day (TID) | ORAL | Status: DC
  Administered 2021-09-09 – 2021-09-10 (×2): 1000 mg via ORAL
  Filled 2021-09-09 (×4): qty 2

## 2021-09-09 MED ORDER — CALCIUM CARBONATE ANTACID 500 MG PO CHEW
2.0000 | CHEWABLE_TABLET | ORAL | Status: DC | PRN
  Administered 2021-09-11: 400 mg via ORAL
  Filled 2021-09-09: qty 2

## 2021-09-09 MED ORDER — OXYCODONE HCL 5 MG PO TABS: 5.0000 mg | ORAL_TABLET | ORAL | Status: DC | PRN

## 2021-09-09 MED ORDER — PRENATAL MULTIVITAMIN CH: 1.0000 | ORAL_TABLET | Freq: Every day | ORAL | Status: DC

## 2021-09-09 MED ORDER — DEXTROSE IN LACTATED RINGERS 5 % IV SOLN: INTRAVENOUS | Status: DC

## 2021-09-09 MED ORDER — SODIUM CHLORIDE 0.9 % IV SOLN
25.0000 mg | Freq: Once | INTRAVENOUS | Status: DC
Start: 2021-09-09 — End: 2021-09-10
  Filled 2021-09-09: qty 1

## 2021-09-09 MED ORDER — LACTATED RINGERS IV BOLUS
1000.0000 mL | Freq: Once | INTRAVENOUS | Status: AC
  Administered 2021-09-09: 1000 mL via INTRAVENOUS

## 2021-09-09 MED ORDER — ACETAMINOPHEN 325 MG PO TABS: 650.0000 mg | ORAL_TABLET | ORAL | Status: DC | PRN

## 2021-09-09 MED ORDER — DOCUSATE SODIUM 100 MG PO CAPS
100.0000 mg | ORAL_CAPSULE | Freq: Every day | ORAL | Status: DC
  Administered 2021-09-11: 100 mg via ORAL
  Filled 2021-09-09: qty 1

## 2021-09-09 MED ORDER — ZOLPIDEM TARTRATE 5 MG PO TABS: 5.0000 mg | ORAL_TABLET | Freq: Every evening | ORAL | Status: DC | PRN

## 2021-09-09 MED ORDER — LACTATED RINGERS IV BOLUS
1000.0000 mL | Freq: Once | INTRAVENOUS | Status: AC
Start: 2021-09-09 — End: 2021-09-09
  Administered 2021-09-09: 1000 mL via INTRAVENOUS

## 2021-09-09 MED ORDER — SODIUM CHLORIDE 0.9 % IV SOLN: INTRAVENOUS | Status: DC | PRN

## 2021-09-09 MED ORDER — METRONIDAZOLE 500 MG/100ML IV SOLN
500.0000 mg | Freq: Three times a day (TID) | INTRAVENOUS | Status: DC
  Administered 2021-09-09 – 2021-09-10 (×2): 500 mg via INTRAVENOUS
  Filled 2021-09-09 (×4): qty 100

## 2021-09-09 MED ORDER — ONDANSETRON HCL 4 MG/2ML IJ SOLN
4.0000 mg | Freq: Once | INTRAMUSCULAR | Status: AC
  Administered 2021-09-09: 4 mg via INTRAVENOUS
  Filled 2021-09-09: qty 2

## 2021-09-09 NOTE — MAU Note (Signed)
Stephanie Cooley is a 26 y.o. at Unknown here in MAU reporting: sever pain in upper pain, woke up uncomfortable at 0700, started getting really uncomfortable around 10, started throwing up at that time also.  Also having some pain in mid back. No bleeding or LOF; +FM Pt unable to sit still or quiet  Onset of complaint: woke up uncomfortable Pain score: 8 Vitals:   09/09/21 1232  BP: (!) 89/51  Pulse: 92  Resp: 20  Temp: 98.7 F (37.1 C)  SpO2: 100%     FHT:160 Lab orders placed from triage:  urine

## 2021-09-09 NOTE — MAU Provider Note (Addendum)
History     CSN: [id number]  Arrival date and time: 09/09/21 1218   Event Date/Time   First Provider Initiated Contact with Patient 09/09/21 1326      Chief Complaint  Patient presents with   Abdominal Pain   Emesis   HPI This is a 26 year old G2 P1-0-0-1 at 25 weeks and 5 days.  She presents with cute onset of abdominal pain with nausea and vomiting that happened earlier today.  The pain initially started in her upper abdomen.  It is now radiating her abdomen in the midline and into her back.  She feels more comfortable sitting upright instead of laying back.  Her vomiting is just stomach contents and bile.  She has been intolerant of food and liquids at this point.  OB History     Gravida  2   Para  1   Term  1   Preterm      AB      Living  1      SAB      IAB      Ectopic      Multiple  0   Live Births  1           Past Medical History:  Diagnosis Date   Medical history non-contributory     Past Surgical History:  Procedure Laterality Date   FOOT SURGERY     widsom teeth      History reviewed. No pertinent family history.  Social History   Tobacco Use   Smoking status: Never   Smokeless tobacco: Never  Vaping Use   Vaping Use: Never used  Substance Use Topics   Alcohol use: Not Currently   Drug use: Never    Allergies: No Known Allergies  Medications Prior to Admission  Medication Sig Dispense Refill Last Dose   acetaminophen (TYLENOL) 325 MG tablet Take 2 tablets (650 mg total) by mouth every 4 (four) hours as needed (for pain scale < 4).      ibuprofen (ADVIL) 600 MG tablet Take 1 tablet (600 mg total) by mouth every 6 (six) hours. 30 tablet 0    Prenatal Vit-Fe Fumarate-FA (PRENATAL MULTIVITAMIN) TABS tablet Take 1 tablet by mouth at bedtime.       Review of Systems Physical Exam   Blood pressure (!) 98/57, pulse 76, temperature 98.7 F (37.1 C), temperature source Oral, resp. rate 20, height 5\' 9"  (1.753 m), weight 92.5  kg, SpO2 100 %, unknown if currently breastfeeding.  Physical Exam Vitals and nursing note reviewed.  Constitutional:      Appearance: She is well-developed.  HENT:     Head: Normocephalic and atraumatic.  Cardiovascular:     Rate and Rhythm: Normal rate and regular rhythm.     Heart sounds: Normal heart sounds.  Pulmonary:     Effort: Pulmonary effort is normal. No respiratory distress.     Breath sounds: No wheezing.  Abdominal:     General: Abdomen is flat.     Palpations: Abdomen is soft.     Tenderness: There is abdominal tenderness in the right upper quadrant and epigastric area. There is rebound. There is no right CVA tenderness, left CVA tenderness or guarding.  Skin:    General: Skin is warm.  Neurological:     General: No focal deficit present.     Mental Status: She is alert.  Psychiatric:        Mood and Affect: Mood normal.  Behavior: Behavior normal.   Results for orders placed or performed during the hospital encounter of 09/09/21 (from the past 24 hour(s))  Urinalysis, Routine w reflex microscopic Urine, Clean Catch     Status: Abnormal   Collection Time: 09/09/21 12:50 PM  Result Value Ref Range   Color, Urine YELLOW YELLOW   APPearance HAZY (A) CLEAR   Specific Gravity, Urine 1.024 1.005 - 1.030   pH 5.0 5.0 - 8.0   Glucose, UA NEGATIVE NEGATIVE mg/dL   Hgb urine dipstick NEGATIVE NEGATIVE   Bilirubin Urine NEGATIVE NEGATIVE   Ketones, ur 20 (A) NEGATIVE mg/dL   Protein, ur NEGATIVE NEGATIVE mg/dL   Nitrite NEGATIVE NEGATIVE   Leukocytes,Ua NEGATIVE NEGATIVE  Comprehensive metabolic panel     Status: Abnormal   Collection Time: 09/09/21  1:30 PM  Result Value Ref Range   Sodium 136 135 - 145 mmol/L   Potassium 3.7 3.5 - 5.1 mmol/L   Chloride 105 98 - 111 mmol/L   CO2 22 22 - 32 mmol/L   Glucose, Bld 93 70 - 99 mg/dL   BUN 8 6 - 20 mg/dL   Creatinine, Ser 0.65 0.44 - 1.00 mg/dL   Calcium 8.7 (L) 8.9 - 10.3 mg/dL   Total Protein 6.9 6.5 - 8.1  g/dL   Albumin 3.1 (L) 3.5 - 5.0 g/dL   AST 21 15 - 41 U/L   ALT 19 0 - 44 U/L   Alkaline Phosphatase 90 38 - 126 U/L   Total Bilirubin 0.6 0.3 - 1.2 mg/dL   GFR, Estimated >60 >60 mL/min   Anion gap 9 5 - 15  Lipase, blood     Status: None   Collection Time: 09/09/21  1:30 PM  Result Value Ref Range   Lipase 27 11 - 51 U/L  CBC with Differential/Platelet     Status: Abnormal   Collection Time: 09/09/21  1:30 PM  Result Value Ref Range   WBC 18.1 (H) 4.0 - 10.5 K/uL   RBC 4.66 3.87 - 5.11 MIL/uL   Hemoglobin 13.3 12.0 - 15.0 g/dL   HCT 41.9 36.0 - 46.0 %   MCV 89.9 80.0 - 100.0 fL   MCH 28.5 26.0 - 34.0 pg   MCHC 31.7 30.0 - 36.0 g/dL   RDW 12.4 11.5 - 15.5 %   Platelets 345 150 - 400 K/uL   nRBC 0.0 0.0 - 0.2 %   Neutrophils Relative % 89 %   Neutro Abs 16.0 (H) 1.7 - 7.7 K/uL   Lymphocytes Relative 7 %   Lymphs Abs 1.3 0.7 - 4.0 K/uL   Monocytes Relative 3 %   Monocytes Absolute 0.6 0.1 - 1.0 K/uL   Eosinophils Relative 0 %   Eosinophils Absolute 0.0 0.0 - 0.5 K/uL   Basophils Relative 0 %   Basophils Absolute 0.0 0.0 - 0.1 K/uL   Immature Granulocytes 1 %   Abs Immature Granulocytes 0.10 (H) 0.00 - 0.07 K/uL     MAU Course  Procedures  MDM 1420  CMP, CBC, Lipase ordered LR 2L  Zofran, dilaudid 1mg   1600 recheck Rechecked Pain improved after second dose of dilaudid improved. Due to rebound tenderness in RLQ, will check MRI  Patient in MRI Patient turned over to Gavin Pound, [name] at 2000.  Truett Mainland, DO 09/09/2021 8:04 PM   Assessment and Plan  Reassessment (8:46 PM) -Dr. Wyonia Hough calls and reports patient with acute appendicitis. -Dr. Sophronia Simas contacted and informed of patient status,  evaluation, interventions, and results. -Patient informed of findings and need for intervention.  Informed that primary ob and general surgery will be at bedside to discuss further.  -Care relinquished to [place].   Maryann Conners MSN, [name] Advanced Practice  Provider, [facility]

## 2021-09-09 NOTE — H&P (Signed)
OB ADMISSION/ HISTORY & PHYSICAL:  Admission Date: 09/09/2021 12:18 PM  Admit Diagnosis: Acute appendicitis  Stephanie Cooley is a 26 y.o. female G2P1001 [redacted]w[redacted]d presenting with cute onset of abdominal pain with nausea and vomiting that happened earlier today.  The pain initially started in her upper abdomen.  It is now radiating her abdomen in the midline and into her back.  She feels more comfortable sitting upright instead of laying back.  Her vomiting is just stomach contents and bile.  She has been intolerant of food and liquids at this point.  History of current pregnancy: G2P1001   Patient entered care with [facility] at 7+5 wks.   EDC 12/18/21 by [place] @8 +5 wk   Anatomy scan:  20 wks, incomplete w/ anterior placenta.    Significant prenatal events:  Patient Active Problem List   Diagnosis Date Noted   Appendicitis 09/09/2021    Prenatal Labs: ABO, Rh: --/--/A POS (06/12 1325) Antibody: NEG (06/12 1325) Rubella:   immune RPR:   NR HBsAg:   NR HIV:   NR GC/CHL: neg/neg Genetics: low-risk female   OB History  Gravida Para Term Preterm AB Living  2 1 1     1   SAB IAB Ectopic Multiple Live Births        0 1    # Outcome Date GA Lbr Len/2nd Weight Sex Delivery Anes PTL Lv  2 Current           1 Term 05/09/20 [redacted]w[redacted]d 09:56 / 00:59 3751 g F Vag-Spont EPI  LIV     Birth Comments: WNL    Medical / Surgical History: Past medical history:  Past Medical History:  Diagnosis Date   Medical history non-contributory     Past surgical history:  Past Surgical History:  Procedure Laterality Date   FOOT SURGERY     widsom teeth     Family History: History reviewed. No pertinent family history.  Social History:  reports that she has never smoked. She has never used smokeless tobacco. She reports that she does not currently use alcohol. She reports that she does not use drugs.  Allergies: Patient has no known allergies.   Current Medications at time of admission:  Prior to Admission  medications   Medication Sig Start Date End Date Taking? Authorizing Provider  acetaminophen (TYLENOL) 325 MG tablet Take 2 tablets (650 mg total) by mouth every 4 (four) hours as needed (for pain scale < 4). 05/11/20   Holshouser, [redacted]w[redacted]d, [name]  ibuprofen (ADVIL) 600 MG tablet Take 1 tablet (600 mg total) by mouth every 6 (six) hours. 05/11/20   Holshouser, Gerhard Munch, [name]  Prenatal Vit-Fe Fumarate-FA (PRENATAL MULTIVITAMIN) TABS tablet Take 1 tablet by mouth at bedtime.    [provider]    Review of Systems: Constitutional: Negative   HENT: Negative   Eyes: Negative   Respiratory: Negative   Cardiovascular: Negative   Gastrointestinal: Negative  Genitourinary: neg for bloody show, neg for LOF   Musculoskeletal: Negative   Skin: Negative   Neurological: Negative   Endo/Heme/Allergies: Negative   Psychiatric/Behavioral: Negative    Physical Exam: VS: Blood pressure 113/61, pulse 72, temperature 98 F (36.7 C), temperature source Oral, resp. rate 16, height 5\' 9"  (1.753 m), weight 92.5 kg, SpO2 100 %, unknown if currently breastfeeding. AAO x3, no signs of distress Cardiovascular: RRR Respiratory: Lung fields clear to ausculation GU/GI: Abdomen gravid, tender, non-distended, active FM, right upper quad pain, Extremities: no edema, negative for pain, tenderness,  and cords  Cervical exam: deferred FHR:155 TOCO: no contractions   Assessment: 26 y.o. G2P1001 [redacted]w[redacted]d  Acute appendicitis  Plan:  Admit to OB Specialty  CBC and T&S @ 0500 Dilaudid 1-2 mg Q 4 hrs PRN pain NPO after midnight Surgical consult  Dr Normand Sloop notified by MAU provider of admission and plan of care  Stephanie Cooley, [name] 09/09/2021 10:33 PM

## 2021-09-10 ENCOUNTER — Encounter (HOSPITAL_COMMUNITY): Payer: Self-pay | Admitting: Obstetrics and Gynecology

## 2021-09-10 ENCOUNTER — Other Ambulatory Visit: Payer: Self-pay

## 2021-09-10 ENCOUNTER — Encounter (HOSPITAL_COMMUNITY)
Admission: AD | Disposition: A | Discharge status: Home / Self Care | Payer: Self-pay | Source: Home / Self Care | Attending: Obstetrics and Gynecology

## 2021-09-10 ENCOUNTER — Inpatient Hospital Stay (HOSPITAL_COMMUNITY): Payer: 59 | Admitting: Certified Registered"

## 2021-09-10 DIAGNOSIS — K3580 Unspecified acute appendicitis: Secondary | ICD-10-CM | POA: Diagnosis present

## 2021-09-10 DIAGNOSIS — Z3A25 25 weeks gestation of pregnancy: Secondary | ICD-10-CM

## 2021-09-10 DIAGNOSIS — O99612 Diseases of the digestive system complicating pregnancy, second trimester: Secondary | ICD-10-CM | POA: Diagnosis not present

## 2021-09-10 HISTORY — PX: LAPAROSCOPIC APPENDECTOMY: SHX408

## 2021-09-10 LAB — TYPE AND SCREEN
ABO/RH(D): A POS
Antibody Screen: NEGATIVE

## 2021-09-10 LAB — CBC
HCT: 31.1 % — ABNORMAL LOW (ref 36.0–46.0)
Hemoglobin: 10.3 g/dL — ABNORMAL LOW (ref 12.0–15.0)
MCH: 29.3 pg (ref 26.0–34.0)
MCHC: 33.1 g/dL (ref 30.0–36.0)
MCV: 88.4 fL (ref 80.0–100.0)
Platelets: 285 10*3/uL (ref 150–400)
RBC: 3.52 MIL/uL — ABNORMAL LOW (ref 3.87–5.11)
RDW: 12.7 % (ref 11.5–15.5)
WBC: 11.3 10*3/uL — ABNORMAL HIGH (ref 4.0–10.5)
nRBC: 0 % (ref 0.0–0.2)

## 2021-09-10 SURGERY — APPENDECTOMY, LAPAROSCOPIC
Anesthesia: General | Site: Abdomen

## 2021-09-10 MED ORDER — HYDROMORPHONE HCL 1 MG/ML IJ SOLN
INTRAMUSCULAR | Status: DC | PRN
  Administered 2021-09-10: .5 mg via INTRAVENOUS

## 2021-09-10 MED ORDER — FENTANYL CITRATE (PF) 250 MCG/5ML IJ SOLN
INTRAMUSCULAR | Status: DC | PRN
Start: 2021-09-10 — End: 2021-09-10
  Administered 2021-09-10: 25 ug via INTRAVENOUS
  Administered 2021-09-10: 100 ug via INTRAVENOUS
  Administered 2021-09-10: 25 ug via INTRAVENOUS
  Administered 2021-09-10: 100 ug via INTRAVENOUS

## 2021-09-10 MED ORDER — BUPIVACAINE-EPINEPHRINE (PF) 0.25% -1:200000 IJ SOLN
INTRAMUSCULAR | Status: AC
  Filled 2021-09-10: qty 30

## 2021-09-10 MED ORDER — ORAL CARE MOUTH RINSE: 15.0000 mL | Freq: Once | OROMUCOSAL | Status: AC

## 2021-09-10 MED ORDER — CHLORHEXIDINE GLUCONATE 0.12 % MT SOLN
OROMUCOSAL | Status: AC
  Administered 2021-09-10: 15 mL via OROMUCOSAL
  Filled 2021-09-10: qty 15

## 2021-09-10 MED ORDER — SODIUM CHLORIDE 0.9 % IV SOLN
2.0000 g | INTRAVENOUS | Status: AC
  Administered 2021-09-10: 2 g via INTRAVENOUS
  Filled 2021-09-10: qty 20

## 2021-09-10 MED ORDER — CHLORHEXIDINE GLUCONATE 0.12 % MT SOLN
15.0000 mL | Freq: Once | OROMUCOSAL | Status: AC
Start: 2021-09-10 — End: 2021-09-10

## 2021-09-10 MED ORDER — POLYETHYLENE GLYCOL 3350 17 G PO PACK: 17.0000 g | PACK | Freq: Every day | ORAL | Status: DC | PRN

## 2021-09-10 MED ORDER — MIDAZOLAM HCL 2 MG/2ML IJ SOLN
INTRAMUSCULAR | Status: AC
  Filled 2021-09-10: qty 2

## 2021-09-10 MED ORDER — BUPIVACAINE-EPINEPHRINE 0.25% -1:200000 IJ SOLN
INTRAMUSCULAR | Status: DC | PRN
  Administered 2021-09-10: 17 mL

## 2021-09-10 MED ORDER — HYDROMORPHONE HCL 1 MG/ML IJ SOLN
INTRAMUSCULAR | Status: AC
  Filled 2021-09-10: qty 0.5

## 2021-09-10 MED ORDER — SUGAMMADEX SODIUM 200 MG/2ML IV SOLN
INTRAVENOUS | Status: DC | PRN
  Administered 2021-09-10: 180 mg via INTRAVENOUS

## 2021-09-10 MED ORDER — EPHEDRINE SULFATE-NACL 50-0.9 MG/10ML-% IV SOSY
PREFILLED_SYRINGE | INTRAVENOUS | Status: DC | PRN
  Administered 2021-09-10: 15 mg via INTRAVENOUS
  Administered 2021-09-10: 10 mg via INTRAVENOUS

## 2021-09-10 MED ORDER — ONDANSETRON HCL 4 MG/2ML IJ SOLN
INTRAMUSCULAR | Status: DC | PRN
  Administered 2021-09-10: 4 mg via INTRAVENOUS

## 2021-09-10 MED ORDER — OXYCODONE HCL 5 MG PO TABS
5.0000 mg | ORAL_TABLET | ORAL | Status: DC | PRN
  Administered 2021-09-10: 5 mg via ORAL
  Filled 2021-09-10: qty 1

## 2021-09-10 MED ORDER — PROPOFOL 10 MG/ML IV BOLUS
INTRAVENOUS | Status: DC | PRN
  Administered 2021-09-10: 110 mg via INTRAVENOUS

## 2021-09-10 MED ORDER — SUCCINYLCHOLINE CHLORIDE 200 MG/10ML IV SOSY
PREFILLED_SYRINGE | INTRAVENOUS | Status: DC | PRN
  Administered 2021-09-10: 140 mg via INTRAVENOUS

## 2021-09-10 MED ORDER — METHOCARBAMOL 500 MG PO TABS
1000.0000 mg | ORAL_TABLET | Freq: Three times a day (TID) | ORAL | Status: DC | PRN
  Filled 2021-09-10: qty 2

## 2021-09-10 MED ORDER — ROCURONIUM BROMIDE 10 MG/ML (PF) SYRINGE
PREFILLED_SYRINGE | INTRAVENOUS | Status: DC | PRN
  Administered 2021-09-10: 10 mg via INTRAVENOUS

## 2021-09-10 MED ORDER — LIDOCAINE 2% (20 MG/ML) 5 ML SYRINGE
INTRAMUSCULAR | Status: DC | PRN
  Administered 2021-09-10: 40 mg via INTRAVENOUS

## 2021-09-10 MED ORDER — PHENYLEPHRINE 80 MCG/ML (10ML) SYRINGE FOR IV PUSH (FOR BLOOD PRESSURE SUPPORT)
PREFILLED_SYRINGE | INTRAVENOUS | Status: DC | PRN
  Administered 2021-09-10: 160 ug via INTRAVENOUS

## 2021-09-10 MED ORDER — MIDAZOLAM HCL 2 MG/2ML IJ SOLN
INTRAMUSCULAR | Status: DC | PRN
  Administered 2021-09-10: 2 mg via INTRAVENOUS

## 2021-09-10 MED ORDER — PHENYLEPHRINE HCL-NACL 20-0.9 MG/250ML-% IV SOLN
INTRAVENOUS | Status: DC | PRN
  Administered 2021-09-10: 50 ug/min via INTRAVENOUS

## 2021-09-10 MED ORDER — 0.9 % SODIUM CHLORIDE (POUR BTL) OPTIME
TOPICAL | Status: DC | PRN
  Administered 2021-09-10: 1000 mL

## 2021-09-10 MED ORDER — LACTATED RINGERS IV SOLN: INTRAVENOUS | Status: DC

## 2021-09-10 MED ORDER — DEXAMETHASONE SODIUM PHOSPHATE 10 MG/ML IJ SOLN
INTRAMUSCULAR | Status: DC | PRN
  Administered 2021-09-10: 10 mg via INTRAVENOUS

## 2021-09-10 MED ORDER — FENTANYL CITRATE (PF) 250 MCG/5ML IJ SOLN
INTRAMUSCULAR | Status: AC
  Filled 2021-09-10: qty 5

## 2021-09-10 MED ORDER — HYDROMORPHONE HCL 1 MG/ML IJ SOLN
1.0000 mg | INTRAMUSCULAR | Status: DC | PRN
  Administered 2021-09-10 – 2021-09-11 (×2): 1 mg via INTRAVENOUS
  Filled 2021-09-10 (×2): qty 1

## 2021-09-10 MED ORDER — METRONIDAZOLE 500 MG/100ML IV SOLN
500.0000 mg | Freq: Three times a day (TID) | INTRAVENOUS | Status: AC
  Administered 2021-09-10 – 2021-09-11 (×3): 500 mg via INTRAVENOUS
  Filled 2021-09-10 (×4): qty 100

## 2021-09-10 MED ORDER — SODIUM CHLORIDE 0.9 % IR SOLN
Status: DC | PRN
  Administered 2021-09-10: 1000 mL

## 2021-09-10 SURGICAL SUPPLY — 46 items
APPLICATOR COTTON TIP 6 STRL (MISCELLANEOUS) ×2 IMPLANT
APPLICATOR COTTON TIP 6IN STRL (MISCELLANEOUS) IMPLANT
APPLIER CLIP ROT 10 11.4 M/L (STAPLE)
BAG COUNTER SPONGE SURGICOUNT (BAG) ×3 IMPLANT
CANISTER SUCT 3000ML PPV (MISCELLANEOUS) ×3 IMPLANT
CHLORAPREP W/TINT 26 (MISCELLANEOUS) ×3 IMPLANT
CLIP APPLIE ROT 10 11.4 M/L (STAPLE) IMPLANT
COVER SURGICAL LIGHT HANDLE (MISCELLANEOUS) ×3 IMPLANT
CUTTER FLEX LINEAR 45M (STAPLE) ×3 IMPLANT
DERMABOND ADVANCED (GAUZE/BANDAGES/DRESSINGS) ×1
DERMABOND ADVANCED .7 DNX12 (GAUZE/BANDAGES/DRESSINGS) ×1 IMPLANT
DRSG TEGADERM 4X4.75 (GAUZE/BANDAGES/DRESSINGS) IMPLANT
ELECT REM PT RETURN 9FT ADLT (ELECTROSURGICAL) ×3
ELECTRODE REM PT RTRN 9FT ADLT (ELECTROSURGICAL) ×2 IMPLANT
ENDOLOOP SUT PDS II  0 18 (SUTURE)
ENDOLOOP SUT PDS II 0 18 (SUTURE) IMPLANT
GAUZE SPONGE 2X2 8PLY STRL LF (GAUZE/BANDAGES/DRESSINGS) IMPLANT
GLOVE BIOGEL M STRL SZ7.5 (GLOVE) ×3 IMPLANT
GLOVE INDICATOR 8.0 STRL GRN (GLOVE) ×6 IMPLANT
GOWN STRL REUS W/ TWL LRG LVL3 (GOWN DISPOSABLE) ×4 IMPLANT
GOWN STRL REUS W/TWL 2XL LVL3 (GOWN DISPOSABLE) ×3 IMPLANT
GOWN STRL REUS W/TWL LRG LVL3 (GOWN DISPOSABLE) ×2
KIT BASIN OR (CUSTOM PROCEDURE TRAY) ×3 IMPLANT
KIT TURNOVER KIT B (KITS) ×3 IMPLANT
NS IRRIG 1000ML POUR BTL (IV SOLUTION) ×3 IMPLANT
PAD ARMBOARD 7.5X6 YLW CONV (MISCELLANEOUS) ×6 IMPLANT
POUCH RETRIEVAL ECOSAC 10 (ENDOMECHANICALS) ×1 IMPLANT
POUCH RETRIEVAL ECOSAC 10MM (ENDOMECHANICALS) ×1
RELOAD 45 VASCULAR/THIN (ENDOMECHANICALS) ×3 IMPLANT
RELOAD STAPLE 45 2.5 WHT GRN (ENDOMECHANICALS) IMPLANT
RELOAD STAPLE 45 3.5 BLU ETS (ENDOMECHANICALS) IMPLANT
RELOAD STAPLE TA45 3.5 REG BLU (ENDOMECHANICALS) IMPLANT
SCISSORS LAP 5X35 DISP (ENDOMECHANICALS) IMPLANT
SET IRRIG TUBING LAPAROSCOPIC (IRRIGATION / IRRIGATOR) ×3 IMPLANT
SET TUBE SMOKE EVAC HIGH FLOW (TUBING) ×3 IMPLANT
SHEARS HARMONIC ACE PLUS 36CM (ENDOMECHANICALS) ×3 IMPLANT
SLEEVE ADV FIXATION 5X100MM (TROCAR) ×2 IMPLANT
SPONGE GAUZE 2X2 STER 10/PKG (GAUZE/BANDAGES/DRESSINGS)
STRIP CLOSURE SKIN 1/2X4 (GAUZE/BANDAGES/DRESSINGS) IMPLANT
SUT CHROMIC 3 0 PS 2 (SUTURE) ×2 IMPLANT
SUT MNCRL AB 4-0 PS2 18 (SUTURE) ×3 IMPLANT
SUT VICRYL 0 UR6 27IN ABS (SUTURE) ×2 IMPLANT
TOWEL GREEN STERILE (TOWEL DISPOSABLE) ×3 IMPLANT
TRAY LAPAROSCOPIC MC (CUSTOM PROCEDURE TRAY) ×3 IMPLANT
TROCAR ADV FIXATION 5X100MM (TROCAR) ×2 IMPLANT
TROCAR XCEL BLUNT TIP 100MML (ENDOMECHANICALS) ×3 IMPLANT

## 2021-09-10 NOTE — Transfer of Care (Signed)
Immediate Anesthesia Transfer of Care Note  Patient: Stephanie Cooley  Procedure(s) Performed: APPENDECTOMY LAPAROSCOPIC (Abdomen)  Patient Location: PACU  Anesthesia Type:General  Level of Consciousness: drowsy and patient cooperative  Airway & Oxygen Therapy: Patient Spontanous Breathing  Post-op Assessment: Report given to RN and Post -op Vital signs reviewed and stable  Post vital signs: Reviewed and stable  Last Vitals:  Vitals Value Taken Time  BP 117/53 09/10/21 1224  Temp    Pulse 113 09/10/21 1226  Resp 16 09/10/21 1226  SpO2 93 % 09/10/21 1226  Vitals shown include unvalidated device data.  Last Pain:  Vitals:   09/10/21 1031  TempSrc:   PainSc: 0-No pain         Complications: No notable events documented.

## 2021-09-10 NOTE — Anesthesia Preprocedure Evaluation (Addendum)
Anesthesia Evaluation  Patient identified by MRN, date of birth, ID band Patient awake    Reviewed: Allergy & Precautions, NPO status , Patient's Chart, lab work & pertinent test results  Airway Mallampati: II  TM Distance: >3 FB Neck ROM: Full    Dental no notable dental hx. (+) Teeth Intact, Dental Advisory Given   Pulmonary neg pulmonary ROS,    Pulmonary exam normal breath sounds clear to auscultation       Cardiovascular negative cardio ROS Normal cardiovascular exam Rhythm:Regular Rate:Normal     Neuro/Psych negative neurological ROS  negative psych ROS   GI/Hepatic negative GI ROS, Neg liver ROS,   Endo/Other  negative endocrine ROS  Renal/GU negative Renal ROS  negative genitourinary   Musculoskeletal negative musculoskeletal ROS (+)   Abdominal   Peds  Hematology  (+) Blood dyscrasia (Hgb 10.3), anemia ,   Anesthesia Other Findings G2P1001 [redacted]w[redacted]d presenting with acute appendictis  Reproductive/Obstetrics (+) Pregnancy                            Anesthesia Physical Anesthesia Plan  ASA: 2  Anesthesia Plan: General   Post-op Pain Management: Tylenol PO (pre-op)*   Induction: Intravenous and Rapid sequence  PONV Risk Score and Plan: 3 and Midazolam, Ondansetron and Dexamethasone  Airway Management Planned: Oral ETT  Additional Equipment:   Intra-op Plan:   Post-operative Plan: Extubation in OR  Informed Consent: I have reviewed the patients History and Physical, chart, labs and discussed the procedure including the risks, benefits and alternatives for the proposed anesthesia with the patient or authorized representative who has indicated his/her understanding and acceptance.     Dental advisory given  Plan Discussed with: CRNA  Anesthesia Plan Comments: (Plan to monitor FHT before and after procedure. )       Anesthesia Quick Evaluation

## 2021-09-10 NOTE — Consult Note (Signed)
Reason for Consult/Chief Complaint: acute appendicitis Consultant: Normand Sloop, MD  Stephanie Cooley is an 26 y.o. female.   HPI: 70F with acute onset of abdominal pain that began at 0700 on 09/09/21 associate with nausea, vomiting, and anorexia. Denies fever/chills. Localized the pain to the midline initially, but pain has now migrated to the right abdomen. No prior abdominal surgery.   Past Medical History:  Diagnosis Date   Medical history non-contributory     Past Surgical History:  Procedure Laterality Date   FOOT SURGERY     widsom teeth      History reviewed. No pertinent family history.  Social History:  reports that she has never smoked. She has never used smokeless tobacco. She reports that she does not currently use alcohol. She reports that she does not use drugs.  Allergies: No Known Allergies  Medications: I have reviewed the patient's current medications.  Results for orders placed or performed during the hospital encounter of 09/09/21 (from the past 48 hour(s))  Urinalysis, Routine w reflex microscopic Urine, Clean Catch     Status: Abnormal   Collection Time: 09/09/21 12:50 PM  Result Value Ref Range   Color, Urine YELLOW YELLOW   APPearance HAZY (A) CLEAR   Specific Gravity, Urine 1.024 1.005 - 1.030   pH 5.0 5.0 - 8.0   Glucose, UA NEGATIVE NEGATIVE mg/dL   Hgb urine dipstick NEGATIVE NEGATIVE   Bilirubin Urine NEGATIVE NEGATIVE   Ketones, ur 20 (A) NEGATIVE mg/dL   Protein, ur NEGATIVE NEGATIVE mg/dL   Nitrite NEGATIVE NEGATIVE   Leukocytes,Ua NEGATIVE NEGATIVE    Comment: Performed at [facility], [address], [place], [place] [address]  Type and screen [facility]     Status: None   Collection Time: 09/09/21  1:25 PM  Result Value Ref Range   ABO/RH(D) A POS    Antibody Screen NEG    Sample Expiration      09/12/2021,2359 Performed at [facility], [address] [place], [place] [address]   Comprehensive  metabolic panel     Status: Abnormal   Collection Time: 09/09/21  1:30 PM  Result Value Ref Range   Sodium 136 135 - 145 mmol/L   Potassium 3.7 3.5 - 5.1 mmol/L   Chloride 105 98 - 111 mmol/L   CO2 22 22 - 32 mmol/L   Glucose, Bld 93 70 - 99 mg/dL    Comment: Glucose reference range applies only to samples taken after fasting for at least 8 hours.   BUN 8 6 - 20 mg/dL   Creatinine, Ser 0.98 0.44 - 1.00 mg/dL   Calcium 8.7 (L) 8.9 - 10.3 mg/dL   Total Protein 6.9 6.5 - 8.1 g/dL   Albumin 3.1 (L) 3.5 - 5.0 g/dL   AST 21 15 - 41 U/L   ALT 19 0 - 44 U/L   Alkaline Phosphatase 90 38 - 126 U/L   Total Bilirubin 0.6 0.3 - 1.2 mg/dL   GFR, Estimated >11 >91 mL/min    Comment: (NOTE) Calculated using the CKD-EPI Creatinine Equation (2021)    Anion gap 9 5 - 15    Comment: Performed at [facility], [address], [place], [place] [address]  Lipase, blood     Status: None   Collection Time: 09/09/21  1:30 PM  Result Value Ref Range   Lipase 27 11 - 51 U/L    Comment: Performed at [facility],  [address] [place], [place] [address]  CBC with Differential/Platelet     Status: Abnormal   Collection Time: 09/09/21  1:30 PM  Result Value Ref Range   WBC 18.1 (H) 4.0 - 10.5 K/uL   RBC 4.66 3.87 - 5.11 MIL/uL   Hemoglobin 13.3 12.0 - 15.0 g/dL   HCT 40.941.9 81.136.0 - 91.446.0 %   MCV 89.9 80.0 - 100.0 fL   MCH 28.5 26.0 - 34.0 pg   MCHC 31.7 30.0 - 36.0 g/dL   RDW 78.212.4 95.611.5 - 21.315.5 %   Platelets 345 150 - 400 K/uL   nRBC 0.0 0.0 - 0.2 %   Neutrophils Relative % 89 %   Neutro Abs 16.0 (H) 1.7 - 7.7 K/uL   Lymphocytes Relative 7 %   Lymphs Abs 1.3 0.7 - 4.0 K/uL   Monocytes Relative 3 %   Monocytes Absolute 0.6 0.1 - 1.0 K/uL   Eosinophils Relative 0 %   Eosinophils Absolute 0.0 0.0 - 0.5 K/uL   Basophils Relative 0 %   Basophils Absolute 0.0 0.0 - 0.1 K/uL   Immature Granulocytes 1 %   Abs Immature Granulocytes 0.10 (H) 0.00 - 0.07 K/uL    Comment: Performed at [facility], [address] [place], [place] [address]  CBC     Status: Abnormal   Collection Time: 09/10/21  3:49 AM  Result Value Ref Range   WBC 11.3 (H) 4.0 - 10.5 K/uL   RBC 3.52 (L) 3.87 - 5.11 MIL/uL   Hemoglobin 10.3 (L) 12.0 - 15.0 g/dL   HCT 84.631.1 (L) 96.236.0 - 95.246.0 %   MCV 88.4 80.0 - 100.0 fL   MCH 29.3 26.0 - 34.0 pg   MCHC 33.1 30.0 - 36.0 g/dL   RDW 84.112.7 32.411.5 - 40.115.5 %   Platelets 285 150 - 400 K/uL   nRBC 0.0 0.0 - 0.2 %    Comment: Performed at [facility], [address] [place], [place] [address]    MR ABDOMEN WO CONTRAST  Result Date: 09/09/2021 CLINICAL DATA:  Upper abdominal pain with nausea vomiting. EXAM: MRI ABDOMEN AND PELVIS WITHOUT CONTRAST TECHNIQUE: Multiplanar multisequence MR imaging was performed of the abdomen and pelvis without the administration of intravenous contrast. COMPARISON:  Abdominal sonogram which was performed on September 09, 2021. FINDINGS: Lower chest: Incidental imaging of the lung bases on very limited assessment is unremarkable. Hepatobiliary: Liver with smooth contours. No pericholecystic stranding. Sludge in the gallbladder. No biliary duct dilation. Pancreas: Normal contour of the pancreas. Normal intrinsic T1 signal within pancreatic parenchyma. Spleen:  Normal size and contour. Adrenals/Urinary Tract:  Adrenal glands are normal. No hydronephrosis or perinephric stranding. Urinary bladder with smooth contours to the extent evaluated, displaced inferiorly by the gravid uterus. Stomach/Bowel: Dilated appendix with edema in the wall. Appendix with periappendiceal stranding and edema within the periappendiceal fat, appendix measuring 9 mm greatest caliber. Trace fluid and/or edema in the RIGHT lower quadrant. No additional acute bowel process to the extent evaluated. Vascular/Lymphatic: Not well evaluated in the absence of contrast. Normal caliber vessels. No adenopathy in the abdomen or pelvis. Other: Trace fluid in the RIGHT lower quadrant. No  gross ascites. No abscess. Reproductive: Normal appearance of the bilateral ovaries. Gravid uterus with fetus in breech presentation. Study not performed for fetal evaluation. Musculoskeletal: No suspicious bone lesions identified. IMPRESSION: 1. Acute appendicitis. 2. While the study is not performed for fetal evaluation there is incidental note made of breech  presentation of the fetus at this time with anterior placenta. The presence of acute appendicitis was discussed with the provider as outlined below. These results were called by telephone at the time of interpretation on 09/09/2021 at 8:52 pm to provider Gerrit Heck, who verbally acknowledged these results. Electronically Signed   By: Donzetta Kohut M.D.   On: 09/09/2021 20:52   MR PELVIS WO CONTRAST  Result Date: 09/09/2021 CLINICAL DATA:  Upper abdominal pain with nausea vomiting. EXAM: MRI ABDOMEN AND PELVIS WITHOUT CONTRAST TECHNIQUE: Multiplanar multisequence MR imaging was performed of the abdomen and pelvis without the administration of intravenous contrast. COMPARISON:  Abdominal sonogram which was performed on September 09, 2021. FINDINGS: Lower chest: Incidental imaging of the lung bases on very limited assessment is unremarkable. Hepatobiliary: Liver with smooth contours. No pericholecystic stranding. Sludge in the gallbladder. No biliary duct dilation. Pancreas: Normal contour of the pancreas. Normal intrinsic T1 signal within pancreatic parenchyma. Spleen:  Normal size and contour. Adrenals/Urinary Tract:  Adrenal glands are normal. No hydronephrosis or perinephric stranding. Urinary bladder with smooth contours to the extent evaluated, displaced inferiorly by the gravid uterus. Stomach/Bowel: Dilated appendix with edema in the wall. Appendix with periappendiceal stranding and edema within the periappendiceal fat, appendix measuring 9 mm greatest caliber. Trace fluid and/or edema in the RIGHT lower quadrant. No additional acute bowel process to  the extent evaluated. Vascular/Lymphatic: Not well evaluated in the absence of contrast. Normal caliber vessels. No adenopathy in the abdomen or pelvis. Other: Trace fluid in the RIGHT lower quadrant. No gross ascites. No abscess. Reproductive: Normal appearance of the bilateral ovaries. Gravid uterus with fetus in breech presentation. Study not performed for fetal evaluation. Musculoskeletal: No suspicious bone lesions identified. IMPRESSION: 1. Acute appendicitis. 2. While the study is not performed for fetal evaluation there is incidental note made of breech presentation of the fetus at this time with anterior placenta. The presence of acute appendicitis was discussed with the provider as outlined below. These results were called by telephone at the time of interpretation on 09/09/2021 at 8:52 pm to provider Gerrit Heck, who verbally acknowledged these results. Electronically Signed   By: Donzetta Kohut M.D.   On: 09/09/2021 20:52   US Abdomen Limited RUQ (LIVER/GB)  Result Date: 09/09/2021 CLINICAL DATA:  Right upper quadrant abdominal pain. Twenty-five weeks pregnant. EXAM: ULTRASOUND ABDOMEN LIMITED RIGHT UPPER QUADRANT COMPARISON:  None Available. FINDINGS: Gallbladder: No gallstones or wall thickening visualized. No sonographic Murphy sign noted by sonographer. Common bile duct: Diameter: 2 mm.  No intrahepatic biliary dilatation. Liver: No focal lesion identified. Within normal limits in parenchymal echogenicity. Portal vein is patent on color Doppler imaging with normal direction of blood flow towards the liver. Other: None. IMPRESSION: Normal right upper quadrant abdominal ultrasound. Electronically Signed   By: Carey Bullocks M.D.   On: 09/09/2021 15:18    ROS 10 point review of systems is negative except as listed above in HPI.   Physical Exam Blood pressure (!) 97/55, pulse 75, temperature 97.8 F (36.6 C), temperature source Oral, resp. rate 18, height 5\' 9"  (1.753 m), weight 92.5 kg, SpO2  100 %, unknown if currently breastfeeding. Constitutional: well-developed, well-nourished HEENT: pupils equal, round, reactive to light, 71mm b/l, moist conjunctiva, external inspection of ears and nose normal, hearing intact Oropharynx: normal oropharyngeal mucosa, normal dentition Neck: no thyromegaly, trachea midline, no midline cervical tenderness to palpation Chest: breath sounds equal bilaterally, normal respiratory effort, no midline or lateral chest wall tenderness to palpation/deformity Abdomen:  soft, gravid uterus, no bruising, no hepatosplenomegaly GU: normal female genitalia  Back: no wounds, no thoracic/lumbar spine tenderness to palpation, no thoracic/lumbar spine stepoffs Rectal: good tone, no blood Extremities: 2+ radial and pedal pulses bilaterally, intact motor and sensation bilateral UE and LE, no peripheral edema MSK: unable to assess gait/station, no clubbing/cyanosis of fingers/toes, normal ROM of all four extremities Skin: warm, dry, no rashes Psych: normal memory, normal mood/affect     Assessment/Plan: 7F with acute appendicitis. Informed consent was obtained after detailed explanation of risks, including bleeding, infection, abscess, staple line leak, stump appendicitis, injury to surrounding structures, and need for conversion to open procedure. All questions answered to the patient's satisfaction. Detailed discussion of fetal risks given gravid uterus and all questions answered to her and her partner's satisfaction.   Diamantina Monks, MD General and Trauma Surgery [facility]

## 2021-09-10 NOTE — H&P (View-Only) (Signed)
  Reason for Consult/Chief Complaint: acute appendicitis Consultant: Dillard, MD  Stephanie Cooley is an 26 y.o. female.   HPI: 26F with acute onset of abdominal pain that began at 0700 on 09/09/21 associate with nausea, vomiting, and anorexia. Denies fever/chills. Localized the pain to the midline initially, but pain has now migrated to the right abdomen. No prior abdominal surgery.   Past Medical History:  Diagnosis Date   Medical history non-contributory     Past Surgical History:  Procedure Laterality Date   FOOT SURGERY     widsom teeth      History reviewed. No pertinent family history.  Social History:  reports that she has never smoked. She has never used smokeless tobacco. She reports that she does not currently use alcohol. She reports that she does not use drugs.  Allergies: No Known Allergies  Medications: I have reviewed the patient's current medications.  Results for orders placed or performed during the hospital encounter of 09/09/21 (from the past 48 hour(s))  Urinalysis, Routine w reflex microscopic Urine, Clean Catch     Status: Abnormal   Collection Time: 09/09/21 12:50 PM  Result Value Ref Range   Color, Urine YELLOW YELLOW   APPearance HAZY (A) CLEAR   Specific Gravity, Urine 1.024 1.005 - 1.030   pH 5.0 5.0 - 8.0   Glucose, UA NEGATIVE NEGATIVE mg/dL   Hgb urine dipstick NEGATIVE NEGATIVE   Bilirubin Urine NEGATIVE NEGATIVE   Ketones, ur 20 (A) NEGATIVE mg/dL   Protein, ur NEGATIVE NEGATIVE mg/dL   Nitrite NEGATIVE NEGATIVE   Leukocytes,Ua NEGATIVE NEGATIVE    Comment: Performed at [facility], [address] [place], [place] [address]  Type and screen [facility]     Status: None   Collection Time: 09/09/21  1:25 PM  Result Value Ref Range   ABO/RH(D) A POS    Antibody Screen NEG    Sample Expiration      09/12/2021,2359 Performed at [facility], [address] [place], [place] [address]   Comprehensive  metabolic panel     Status: Abnormal   Collection Time: 09/09/21  1:30 PM  Result Value Ref Range   Sodium 136 135 - 145 mmol/L   Potassium 3.7 3.5 - 5.1 mmol/L   Chloride 105 98 - 111 mmol/L   CO2 22 22 - 32 mmol/L   Glucose, Bld 93 70 - 99 mg/dL    Comment: Glucose reference range applies only to samples taken after fasting for at least 8 hours.   BUN 8 6 - 20 mg/dL   Creatinine, Ser 0.65 0.44 - 1.00 mg/dL   Calcium 8.7 (L) 8.9 - 10.3 mg/dL   Total Protein 6.9 6.5 - 8.1 g/dL   Albumin 3.1 (L) 3.5 - 5.0 g/dL   AST 21 15 - 41 U/L   ALT 19 0 - 44 U/L   Alkaline Phosphatase 90 38 - 126 U/L   Total Bilirubin 0.6 0.3 - 1.2 mg/dL   GFR, Estimated >60 >60 mL/min    Comment: (NOTE) Calculated using the CKD-EPI Creatinine Equation (2021)    Anion gap 9 5 - 15    Comment: Performed at [facility], [address] [place], [place] [address]  Lipase, blood     Status: None   Collection Time: 09/09/21  1:30 PM  Result Value Ref Range   Lipase 27 11 - 51 U/L    Comment: Performed at [facility],   [address] [place], [place] [address]  CBC with Differential/Platelet     Status: Abnormal   Collection Time: 09/09/21  1:30 PM  Result Value Ref Range   WBC 18.1 (H) 4.0 - 10.5 K/uL   RBC 4.66 3.87 - 5.11 MIL/uL   Hemoglobin 13.3 12.0 - 15.0 g/dL   HCT 40.941.9 81.136.0 - 91.446.0 %   MCV 89.9 80.0 - 100.0 fL   MCH 28.5 26.0 - 34.0 pg   MCHC 31.7 30.0 - 36.0 g/dL   RDW 78.212.4 95.611.5 - 21.315.5 %   Platelets 345 150 - 400 K/uL   nRBC 0.0 0.0 - 0.2 %   Neutrophils Relative % 89 %   Neutro Abs 16.0 (H) 1.7 - 7.7 K/uL   Lymphocytes Relative 7 %   Lymphs Abs 1.3 0.7 - 4.0 K/uL   Monocytes Relative 3 %   Monocytes Absolute 0.6 0.1 - 1.0 K/uL   Eosinophils Relative 0 %   Eosinophils Absolute 0.0 0.0 - 0.5 K/uL   Basophils Relative 0 %   Basophils Absolute 0.0 0.0 - 0.1 K/uL   Immature Granulocytes 1 %   Abs Immature Granulocytes 0.10 (H) 0.00 - 0.07 K/uL    Comment: Performed at [facility], [address] [place], [place] [address]  CBC     Status: Abnormal   Collection Time: 09/10/21  3:49 AM  Result Value Ref Range   WBC 11.3 (H) 4.0 - 10.5 K/uL   RBC 3.52 (L) 3.87 - 5.11 MIL/uL   Hemoglobin 10.3 (L) 12.0 - 15.0 g/dL   HCT 84.631.1 (L) 96.236.0 - 95.246.0 %   MCV 88.4 80.0 - 100.0 fL   MCH 29.3 26.0 - 34.0 pg   MCHC 33.1 30.0 - 36.0 g/dL   RDW 84.112.7 32.411.5 - 40.115.5 %   Platelets 285 150 - 400 K/uL   nRBC 0.0 0.0 - 0.2 %    Comment: Performed at [facility], [address] [place], [place] [address]    MR ABDOMEN WO CONTRAST  Result Date: 09/09/2021 CLINICAL DATA:  Upper abdominal pain with nausea vomiting. EXAM: MRI ABDOMEN AND PELVIS WITHOUT CONTRAST TECHNIQUE: Multiplanar multisequence MR imaging was performed of the abdomen and pelvis without the administration of intravenous contrast. COMPARISON:  Abdominal sonogram which was performed on September 09, 2021. FINDINGS: Lower chest: Incidental imaging of the lung bases on very limited assessment is unremarkable. Hepatobiliary: Liver with smooth contours. No pericholecystic stranding. Sludge in the gallbladder. No biliary duct dilation. Pancreas: Normal contour of the pancreas. Normal intrinsic T1 signal within pancreatic parenchyma. Spleen:  Normal size and contour. Adrenals/Urinary Tract:  Adrenal glands are normal. No hydronephrosis or perinephric stranding. Urinary bladder with smooth contours to the extent evaluated, displaced inferiorly by the gravid uterus. Stomach/Bowel: Dilated appendix with edema in the wall. Appendix with periappendiceal stranding and edema within the periappendiceal fat, appendix measuring 9 mm greatest caliber. Trace fluid and/or edema in the RIGHT lower quadrant. No additional acute bowel process to the extent evaluated. Vascular/Lymphatic: Not well evaluated in the absence of contrast. Normal caliber vessels. No adenopathy in the abdomen or pelvis. Other: Trace fluid in the RIGHT lower quadrant. No  gross ascites. No abscess. Reproductive: Normal appearance of the bilateral ovaries. Gravid uterus with fetus in breech presentation. Study not performed for fetal evaluation. Musculoskeletal: No suspicious bone lesions identified. IMPRESSION: 1. Acute appendicitis. 2. While the study is not performed for fetal evaluation there is incidental note made of breech  presentation of the fetus at this time with anterior placenta. The presence of acute appendicitis was discussed with the provider as outlined below. These results were called by telephone at the time of interpretation on 09/09/2021 at 8:52 pm to provider Stephanie Cooley, who verbally acknowledged these results. Electronically Signed   By: Donzetta Kohut M.D.   On: 09/09/2021 20:52   MR PELVIS WO CONTRAST  Result Date: 09/09/2021 CLINICAL DATA:  Upper abdominal pain with nausea vomiting. EXAM: MRI ABDOMEN AND PELVIS WITHOUT CONTRAST TECHNIQUE: Multiplanar multisequence MR imaging was performed of the abdomen and pelvis without the administration of intravenous contrast. COMPARISON:  Abdominal sonogram which was performed on September 09, 2021. FINDINGS: Lower chest: Incidental imaging of the lung bases on very limited assessment is unremarkable. Hepatobiliary: Liver with smooth contours. No pericholecystic stranding. Sludge in the gallbladder. No biliary duct dilation. Pancreas: Normal contour of the pancreas. Normal intrinsic T1 signal within pancreatic parenchyma. Spleen:  Normal size and contour. Adrenals/Urinary Tract:  Adrenal glands are normal. No hydronephrosis or perinephric stranding. Urinary bladder with smooth contours to the extent evaluated, displaced inferiorly by the gravid uterus. Stomach/Bowel: Dilated appendix with edema in the wall. Appendix with periappendiceal stranding and edema within the periappendiceal fat, appendix measuring 9 mm greatest caliber. Trace fluid and/or edema in the RIGHT lower quadrant. No additional acute bowel process to  the extent evaluated. Vascular/Lymphatic: Not well evaluated in the absence of contrast. Normal caliber vessels. No adenopathy in the abdomen or pelvis. Other: Trace fluid in the RIGHT lower quadrant. No gross ascites. No abscess. Reproductive: Normal appearance of the bilateral ovaries. Gravid uterus with fetus in breech presentation. Study not performed for fetal evaluation. Musculoskeletal: No suspicious bone lesions identified. IMPRESSION: 1. Acute appendicitis. 2. While the study is not performed for fetal evaluation there is incidental note made of breech presentation of the fetus at this time with anterior placenta. The presence of acute appendicitis was discussed with the provider as outlined below. These results were called by telephone at the time of interpretation on 09/09/2021 at 8:52 pm to provider Stephanie Cooley, who verbally acknowledged these results. Electronically Signed   By: Donzetta Kohut M.D.   On: 09/09/2021 20:52   US Abdomen Limited RUQ (LIVER/GB)  Result Date: 09/09/2021 CLINICAL DATA:  Right upper quadrant abdominal pain. Twenty-five weeks pregnant. EXAM: ULTRASOUND ABDOMEN LIMITED RIGHT UPPER QUADRANT COMPARISON:  None Available. FINDINGS: Gallbladder: No gallstones or wall thickening visualized. No sonographic Murphy sign noted by sonographer. Common bile duct: Diameter: 2 mm.  No intrahepatic biliary dilatation. Liver: No focal lesion identified. Within normal limits in parenchymal echogenicity. Portal vein is patent on color Doppler imaging with normal direction of blood flow towards the liver. Other: None. IMPRESSION: Normal right upper quadrant abdominal ultrasound. Electronically Signed   By: Carey Bullocks M.D.   On: 09/09/2021 15:18    ROS 10 point review of systems is negative except as listed above in HPI.   Physical Exam Blood pressure (!) 97/55, pulse 75, temperature 97.8 F (36.6 C), temperature source Oral, resp. rate 18, height 5\' 9"  (1.753 m), weight 92.5 kg, SpO2  100 %, unknown if currently breastfeeding. Constitutional: well-developed, well-nourished HEENT: pupils equal, round, reactive to light, 71mm b/l, moist conjunctiva, external inspection of ears and nose normal, hearing intact Oropharynx: normal oropharyngeal mucosa, normal dentition Neck: no thyromegaly, trachea midline, no midline cervical tenderness to palpation Chest: breath sounds equal bilaterally, normal respiratory effort, no midline or lateral chest wall tenderness to palpation/deformity Abdomen:  soft, gravid uterus, no bruising, no hepatosplenomegaly GU: normal female genitalia  Back: no wounds, no thoracic/lumbar spine tenderness to palpation, no thoracic/lumbar spine stepoffs Rectal: good tone, no blood Extremities: 2+ radial and pedal pulses bilaterally, intact motor and sensation bilateral UE and LE, no peripheral edema MSK: unable to assess gait/station, no clubbing/cyanosis of fingers/toes, normal ROM of all four extremities Skin: warm, dry, no rashes Psych: normal memory, normal mood/affect     Assessment/Plan: 7F with acute appendicitis. Informed consent was obtained after detailed explanation of risks, including bleeding, infection, abscess, staple line leak, stump appendicitis, injury to surrounding structures, and need for conversion to open procedure. All questions answered to the patient's satisfaction. Detailed discussion of fetal risks given gravid uterus and all questions answered to her and her partner's satisfaction.   Diamantina Monks, MD General and Trauma Surgery [facility]

## 2021-09-10 NOTE — Op Note (Signed)
Stephanie Cooley [id number] 06/01/1995 09/10/2021  Appendectomy, Lap, Procedure Note  Indications: The patient presented with a history of right-sided abdominal pain. A CT revealed findings consistent with acute appendicitis.  Patient is also approximately [redacted] weeks pregnant.  Fetal heart tones were checked this morning prior to going to the OR and were present.  Please see the chart for additional details regarding risk and benefits of the proposed procedure  Pre-operative Diagnosis: acute appendicitis  Post-operative Diagnosis: Acute suppurative appendicitis  Surgeon: Gaynelle Adu MD  Assistants: None  Anesthesia: General endotracheal anesthesia  Procedure Details  The patient was seen again in the Holding Room. The risks, benefits, complications, treatment options, and expected outcomes were discussed with the patient and/or family. The possibilities of perforation of viscus, bleeding, recurrent infection, the need for additional procedures, failure to diagnose a condition, and creating a complication requiring transfusion or operation were discussed. There was concurrence with the proposed plan and informed consent was obtained. The site of surgery was properly noted. The patient was taken to Operating Room, identified as Stephanie Cooley and the procedure verified as Appendectomy. A Time Out was held and the above information confirmed.  The patient was placed in the supine position and general anesthesia was induced, along with placement of orogastric tube, SCDs. The patient voided prior to surgery. The abdomen was prepped and draped in a sterile fashion. A 1.5 centimeter supraumbilical incision was made through an old bellybutton ring scar.  Patient had a gravid uterus.  The umbilical stalk was elevated, and the midline fascia was incised with a #11 blade.  A finger sweep was used to confirm entrance into the peritoneal cavity.  A pursestring suture was passed around the incision with a 0  Vicryl.  A 44mm Hasson was introduced into the abdomen and the tails of the suture were used to hold the Hasson in place.   The pneumoperitoneum was then established to steady pressure of 15 mmHg.  Additional 5 mm cannulas then placed in the left lower quadrant of the abdomen and the lower midline under direct visualization. A careful evaluation of the entire abdomen was carried out. The patient was placed in slight reverse Trendelenburg and left lateral decubitus position.  Patient had a gravid uterus that came up almost to the level of her umbilicus.  The patient was found to have an inflamed appendix that was sitting in the right lateral abdomen in the paracolic gutter.  There is some purulent fluid in this location consistent with suppurative appendicitis. There was no evidence of perforation.  The appendix was carefully dissected. The appendix was was skeletonized with the harmonic scalpel.   The appendix was divided at its base using an endo-GIA stapler with a white load. No appendiceal stump was left in place. The appendix was removed from the abdomen with an Ecco bag through the umbilical port.  There was no evidence of bleeding, leakage, or complication after division of the appendix. Irrigation was also performed and irrigate suctioned from the abdomen as well.  The umbilical port site was closed with the purse string suture. Additional interrupted 0 vicryl sutures were placed in the supraumbilical fascia to reinforce the closure with PMI suture passer with lap guidance. Local infiltrated. The closure was viewed laparoscopically. There was no residual palpable fascial defect.  The trocar site skin wounds were closed with 4-0 Monocryl. I reapproximated the belly button ring scar with an interrupted 3-0 chromic suture. Dermaond applied to the skin incisions.  Instrument, sponge, and needle  counts were correct at the conclusion of the case.   Findings: The appendix was found to be inflamed. There  was purulence in the right mid abdomen around the appendix. There were not signs of necrosis.  There was not perforation. There was not abscess formation. CASE DATA:  Type of patient?: DOW CASE (Surgical Hospitalist [facility])  Status of Case? EMERGENT Add On  Infection Present At Time Of Surgery (PATOS)?   See above     Estimated Blood Loss:  Minimal         Drains: none         Specimens: appendix         Complications:  None; patient tolerated the procedure well.         Disposition: PACU - hemodynamically stable.         Condition: stable  Mary Sella. Andrey Campanile, MD, FACS General, Bariatric, & Minimally Invasive Surgery [facility], [place]

## 2021-09-10 NOTE — Progress Notes (Signed)
I spoke with Dr Bedelia Person around 9:40 pm.  She advised me that the patient will be started in antibiotics and seen by someone from her team.   She will go to the OR on Tuesday and the route , ex lap or L/ S will be determined by the surgeon

## 2021-09-10 NOTE — Anesthesia Procedure Notes (Signed)
Procedure Name: Intubation Date/Time: 09/10/2021 11:17 AM  Performed by: Rosiland Oz, CRNAPre-anesthesia Checklist: Patient identified, Emergency Drugs available, Suction available, Patient being monitored and Timeout performed Patient Re-evaluated:Patient Re-evaluated prior to induction Oxygen Delivery Method: Circle system utilized Preoxygenation: Pre-oxygenation with 100% oxygen Induction Type: IV induction and Rapid sequence Laryngoscope Size: Miller and 3 Grade View: Grade I Tube type: Oral Tube size: 7.0 mm Number of attempts: 1 Airway Equipment and Method: Stylet Placement Confirmation: ETT inserted through vocal cords under direct vision, positive ETCO2 and breath sounds checked- equal and bilateral Secured at: 21 cm Tube secured with: Tape Dental Injury: Teeth and Oropharynx as per pre-operative assessment

## 2021-09-10 NOTE — Progress Notes (Addendum)
Subjective: Patient reports continued abdominal pain. She denies any recent nausea or vomiting. She denies fevers/chills.  Objective: Blood pressure 104/64, pulse 90, temperature 98.4 F (36.9 C), temperature source Oral, resp. rate 18, height 5\' 9"  (1.753 m), weight 92.5 kg, SpO2 99 %, unknown if currently breastfeeding.   General: alert, cooperative, and no distress Resp: clear to auscultation bilaterally Cardio: regular rate and rhythm, S1, S2 normal, no murmur, click, rub or gallop GI: Soft, tender to palpation on right upper quadrant and epigastric area. No rebound, no guarding.   Extremities: extremities normal, atraumatic, no cyanosis or edema NST: 150 baseline, moderate variability, reactive TOCO: no contractions.     Latest Ref Rng & Units 09/10/2021    3:49 AM 09/09/2021    1:30 PM 05/10/2020    5:13 AM  CBC  WBC 4.0 - 10.5 K/uL 11.3  18.1  15.2   Hemoglobin 12.0 - 15.0 g/dL 07/08/2020  68.1  27.5   Hematocrit 36.0 - 46.0 % 31.1  41.9  32.9   Platelets 150 - 400 K/uL 285  345  265     Assessment/Plan: 26 y/o G2P1001 at 25 weeks 6 days EGA with acute appendicitis, - For laparoscopic appendectomy per surgery service. - Discussed fetal monitoring with MFM- Dr. 17.0: Recommendation is NST before the surgery (already done) and once patient is back on OB Specialty care then do NST for 3 hours. - C/w antibiotics Ceftriaxone and metronidazole.   LOS: 1 day    Ma Rings, MD.  09/10/2021, 10:01 AM

## 2021-09-10 NOTE — Anesthesia Postprocedure Evaluation (Signed)
Anesthesia Post Note  Patient: Stephanie Cooley  Procedure(s) Performed: APPENDECTOMY LAPAROSCOPIC (Abdomen)     Patient location during evaluation: PACU Anesthesia Type: General Level of consciousness: awake and alert Pain management: pain level controlled Vital Signs Assessment: post-procedure vital signs reviewed and stable Respiratory status: spontaneous breathing, nonlabored ventilation, respiratory function stable and patient connected to nasal cannula oxygen Cardiovascular status: blood pressure returned to baseline and stable Postop Assessment: no apparent nausea or vomiting Anesthetic complications: no   No notable events documented.  Last Vitals:  Vitals:   09/10/21 1239 09/10/21 1254  BP: 112/63 108/61  Pulse: (!) 111 (!) 104  Resp: 20 16  Temp:  36.7 C  SpO2: 94% 95%    Last Pain:  Vitals:   09/10/21 1254  TempSrc:   PainSc: 3                  Ivelisse Culverhouse L Yara Tomkinson

## 2021-09-10 NOTE — Discharge Instructions (Signed)
[facility] ? ?Please arrive at least 30 min before your appointment to complete your check in paperwork.  If you are unable to arrive 30 min prior to your appointment time we may have to cancel or reschedule you. ?LAPAROSCOPIC SURGERY: POST OP INSTRUCTIONS ?Always review your discharge instruction sheet given to you by the facility where your surgery was performed. ?IF YOU HAVE DISABILITY OR FAMILY LEAVE FORMS, YOU MUST BRING THEM TO THE OFFICE FOR PROCESSING.   ?DO NOT GIVE THEM TO YOUR DOCTOR. ? ?PAIN CONTROL ? ?First take acetaminophen (Tylenol) to control your pain after surgery.  Follow directions on package.  Taking acetaminophen (Tylenol) regularly after surgery will help to control your pain and lower the amount of prescription pain medication you may need.  You should not take more than 4,000 mg (4 grams) of acetaminophen (Tylenol) in 24 hours.   ?A prescription for pain medication may be given to you upon discharge.  Take your pain medication as prescribed, if you still have uncontrolled pain after taking acetaminophen (Tylenol). ?Use ice packs to help control pain. ?If you need a refill on your pain medication, please contact your pharmacy.  They will contact our office to request authorization. Prescriptions will not be filled after 5pm or on week-ends. ? ?HOME MEDICATIONS ?Take your usually prescribed medications unless otherwise directed. ? ?DIET ?You should follow a light diet the first few days after arrival home.  Be sure to include lots of fluids daily. Avoid fatty, fried foods.  ? ?CONSTIPATION ?It is common to experience some constipation after surgery and if you are taking pain medication.  Increasing fluid intake and taking a stool softener (such as Colace) will usually help or prevent this problem from occurring.  A mild laxative (Milk of Magnesia or Miralax) should be taken according to package instructions if there are no bowel movements after 48  hours. ? ?WOUND/INCISION CARE ?Most patients will experience some swelling and bruising in the area of the incisions.  Ice packs will help.  Swelling and bruising can take several days to resolve.  ?Unless discharge instructions indicate otherwise, follow guidelines below  ?STERI-STRIPS - you may remove your outer bandages 48 hours after surgery, and you may shower at that time.  You have steri-strips (small skin tapes) in place directly over the incision.  These strips should be left on the skin for 7-10 days.   ?DERMABOND/SKIN GLUE - you may shower in 24 hours.  The glue will flake off over the next 2-3 weeks. ?Any sutures or staples will be removed at the office during your follow-up visit. ? ?ACTIVITIES ?You may resume regular (light) daily activities beginning the next day--such as daily self-care, walking, climbing stairs--gradually increasing activities as tolerated.  You may have sexual intercourse when it is comfortable.  Refrain from any heavy lifting or straining until approved by your doctor. ?You may drive when you are no longer taking prescription pain medication, you can comfortably wear a seatbelt, and you can safely maneuver your car and apply brakes. ? ?FOLLOW-UP ?You should see your doctor in the office for a follow-up appointment approximately 2-3 weeks after your surgery.  You should have been given your post-op/follow-up appointment when your surgery was scheduled.  If you did not receive a post-op/follow-up appointment, make sure that you call for this appointment within a day or two after you arrive home to insure a convenient appointment time. ? ? ?WHEN TO CALL YOUR DOCTOR: ?Fever over 101.0 ?Inability to   urinate ?Continued bleeding from incision. ?Increased pain, redness, or drainage from the incision. ?Increasing abdominal pain ? ?The clinic staff is available to answer your questions during regular business hours.  Please don?t hesitate to call and ask to speak to one of the nurses for  clinical concerns.  If you have a medical emergency, go to the nearest emergency room or call 911.  A surgeon from [facility] is always on call at the hospital. ?[address], [address], [place], [place]  [address] ? [address] [address], [place], [place]   [address] ?[phone] ? [phone] ? FAX [phone] ? ?

## 2021-09-10 NOTE — Interval H&P Note (Signed)
History and Physical Interval Note:  09/10/2021 11:03 AM  Stephanie Cooley  has presented today for surgery, with the diagnosis of acute appendicitis.  The various methods of treatment have been discussed with the patient and family. After consideration of risks, benefits and other options for treatment, the patient has consented to  Procedure(s): APPENDECTOMY LAPAROSCOPIC (N/A) APPENDECTOMY (N/A) as a surgical intervention.  The patient's history has been reviewed, patient examined, no change in status, stable for surgery.  I have reviewed the patient's chart and labs.  Questions were answered to the patient's satisfaction.    We discussed the etiology and management of acute appendicitis. We discussed operative and nonoperative management.  I recommended operative management along with IV antibiotics.  We discussed laparoscopic appendectomy. We discussed the risk and benefits of surgery including but not limited to bleeding, infection, injury to surrounding structures, need to convert to an open procedure, blood clot formation, post operative abscess or wound infection, staple line complications such as leak or bleeding, hernia formation, post operative ileus, need for additional procedures, anesthesia complications, pregnancy loss, premature labor, and the typical postoperative course. I explained that the patient should expect a good improvement in their symptoms.  Greer Pickerel

## 2021-09-11 ENCOUNTER — Encounter (HOSPITAL_COMMUNITY): Payer: Self-pay | Admitting: General Surgery

## 2021-09-11 DIAGNOSIS — O99612 Diseases of the digestive system complicating pregnancy, second trimester: Secondary | ICD-10-CM | POA: Diagnosis not present

## 2021-09-11 LAB — CBC
HCT: 32.5 % — ABNORMAL LOW (ref 36.0–46.0)
Hemoglobin: 10.3 g/dL — ABNORMAL LOW (ref 12.0–15.0)
MCH: 28.5 pg (ref 26.0–34.0)
MCHC: 31.7 g/dL (ref 30.0–36.0)
MCV: 89.8 fL (ref 80.0–100.0)
Platelets: 272 10*3/uL (ref 150–400)
RBC: 3.62 MIL/uL — ABNORMAL LOW (ref 3.87–5.11)
RDW: 12.6 % (ref 11.5–15.5)
WBC: 11.4 10*3/uL — ABNORMAL HIGH (ref 4.0–10.5)
nRBC: 0 % (ref 0.0–0.2)

## 2021-09-11 LAB — SURGICAL PATHOLOGY

## 2021-09-11 MED ORDER — IBUPROFEN 600 MG PO TABS: 600.0000 mg | ORAL_TABLET | Freq: Four times a day (QID) | ORAL | 0 refills | Status: DC | PRN

## 2021-09-11 MED ORDER — ACETAMINOPHEN 500 MG PO TABS: 1000.0000 mg | ORAL_TABLET | Freq: Four times a day (QID) | ORAL | 0 refills | Status: AC | PRN

## 2021-09-11 MED ORDER — DOCUSATE SODIUM 100 MG PO CAPS: 100.0000 mg | ORAL_CAPSULE | Freq: Two times a day (BID) | ORAL | 3 refills | Status: AC | PRN

## 2021-09-11 MED ORDER — OXYCODONE HCL 5 MG PO TABS: 5.0000 mg | ORAL_TABLET | ORAL | 0 refills | Status: DC | PRN

## 2021-09-11 MED ORDER — POLYETHYLENE GLYCOL 3350 17 G PO PACK: 17.0000 g | PACK | Freq: Every day | ORAL | 0 refills | Status: AC | PRN

## 2021-09-11 MED ORDER — OXYCODONE-ACETAMINOPHEN 5-325 MG PO TABS: 1.0000 | ORAL_TABLET | Freq: Four times a day (QID) | ORAL | Status: DC | PRN

## 2021-09-11 MED ORDER — METHOCARBAMOL 1000 MG PO TABS: 1000.0000 mg | ORAL_TABLET | Freq: Three times a day (TID) | ORAL | 3 refills | Status: DC | PRN

## 2021-09-11 MED ORDER — OXYCODONE HCL 5 MG PO TABS
5.0000 mg | ORAL_TABLET | ORAL | Status: DC | PRN
  Administered 2021-09-11: 10 mg via ORAL
  Filled 2021-09-11: qty 2

## 2021-09-11 NOTE — Progress Notes (Signed)
   09/11/21 1200  Departure Condition  Departure Condition Good  Mobility at [facility]  Patient/Caregiver Teaching Teach Back Method Used;Discharge instructions reviewed;Prescriptions reviewed;Follow-up care reviewed;Pain management discussed;Medications discussed;Patient/caregiver verbalized understanding  Departure Mode With significant other  Was procedural sedation performed on this patient during this visit? Yes   Patient alert and oriented x4, VS and pain stable.

## 2021-09-11 NOTE — Progress Notes (Signed)
1 Day Post-Op  Subjective: Patient has pain at a level 3 right now.  Most of her pain is at her umbilicus as expected.  Ate a hot dog and fries last night for supper.  ROS: See above, otherwise other systems negative  Objective: Vital signs in last 24 hours: Temp:  [97.9 F (36.6 C)-98.6 F (37 C)] 98 F (36.7 C) (06/14 0738) Pulse Rate:  [79-113] 86 (06/14 0738) Resp:  [16-20] 17 (06/14 0738) BP: (88-117)/(38-73) 107/63 (06/14 0738) SpO2:  [92 %-100 %] 97 % (06/14 0738) Weight:  [90.7 kg] 90.7 kg (06/13 1021) Last BM Date : 09/09/21  Intake/Output from previous day: 06/13 0701 - 06/14 0700 In: 2762.3 [P.O.:1000; I.V.:1503.5; IV Piggyback:258.8] Out: 3020 [Urine:3000; Blood:20] Intake/Output this shift: No intake/output data recorded.  PE: Abd: soft, appropriately tender, gravid, +BS, incisions c/d/i  Lab Results:  Recent Labs    09/10/21 0349 09/11/21 0708  WBC 11.3* 11.4*  HGB 10.3* 10.3*  HCT 31.1* 32.5*  PLT 285 272   BMET Recent Labs    09/09/21 1330  NA 136  K 3.7  CL 105  CO2 22  GLUCOSE 93  BUN 8  CREATININE 0.65  CALCIUM 8.7*   PT/INR No results for input(s): "LABPROT", "INR" in the last 72 hours. CMP     Component Value Date/Time   NA 136 09/09/2021 1330   K 3.7 09/09/2021 1330   CL 105 09/09/2021 1330   CO2 22 09/09/2021 1330   GLUCOSE 93 09/09/2021 1330   BUN 8 09/09/2021 1330   CREATININE 0.65 09/09/2021 1330   CALCIUM 8.7 (L) 09/09/2021 1330   PROT 6.9 09/09/2021 1330   ALBUMIN 3.1 (L) 09/09/2021 1330   AST 21 09/09/2021 1330   ALT 19 09/09/2021 1330   ALKPHOS 90 09/09/2021 1330   BILITOT 0.6 09/09/2021 1330   GFRNONAA >60 09/09/2021 1330   Lipase     Component Value Date/Time   LIPASE 27 09/09/2021 1330       Studies/Results: MR ABDOMEN WO CONTRAST  Result Date: 09/09/2021 CLINICAL DATA:  Upper abdominal pain with nausea vomiting. EXAM: MRI ABDOMEN AND PELVIS WITHOUT CONTRAST TECHNIQUE: Multiplanar multisequence  MR imaging was performed of the abdomen and pelvis without the administration of intravenous contrast. COMPARISON:  Abdominal sonogram which was performed on September 09, 2021. FINDINGS: Lower chest: Incidental imaging of the lung bases on very limited assessment is unremarkable. Hepatobiliary: Liver with smooth contours. No pericholecystic stranding. Sludge in the gallbladder. No biliary duct dilation. Pancreas: Normal contour of the pancreas. Normal intrinsic T1 signal within pancreatic parenchyma. Spleen:  Normal size and contour. Adrenals/Urinary Tract:  Adrenal glands are normal. No hydronephrosis or perinephric stranding. Urinary bladder with smooth contours to the extent evaluated, displaced inferiorly by the gravid uterus. Stomach/Bowel: Dilated appendix with edema in the wall. Appendix with periappendiceal stranding and edema within the periappendiceal fat, appendix measuring 9 mm greatest caliber. Trace fluid and/or edema in the RIGHT lower quadrant. No additional acute bowel process to the extent evaluated. Vascular/Lymphatic: Not well evaluated in the absence of contrast. Normal caliber vessels. No adenopathy in the abdomen or pelvis. Other: Trace fluid in the RIGHT lower quadrant. No gross ascites. No abscess. Reproductive: Normal appearance of the bilateral ovaries. Gravid uterus with fetus in breech presentation. Study not performed for fetal evaluation. Musculoskeletal: No suspicious bone lesions identified. IMPRESSION: 1. Acute appendicitis. 2. While the study is not performed for fetal evaluation there is incidental note made of breech presentation of  the fetus at this time with anterior placenta. The presence of acute appendicitis was discussed with the provider as outlined below. These results were called by telephone at the time of interpretation on 09/09/2021 at 8:52 pm to provider Gavin Pound, who verbally acknowledged these results. Electronically Signed   By: Zetta Bills M.D.   On: 09/09/2021  20:52   MR PELVIS WO CONTRAST  Result Date: 09/09/2021 CLINICAL DATA:  Upper abdominal pain with nausea vomiting. EXAM: MRI ABDOMEN AND PELVIS WITHOUT CONTRAST TECHNIQUE: Multiplanar multisequence MR imaging was performed of the abdomen and pelvis without the administration of intravenous contrast. COMPARISON:  Abdominal sonogram which was performed on September 09, 2021. FINDINGS: Lower chest: Incidental imaging of the lung bases on very limited assessment is unremarkable. Hepatobiliary: Liver with smooth contours. No pericholecystic stranding. Sludge in the gallbladder. No biliary duct dilation. Pancreas: Normal contour of the pancreas. Normal intrinsic T1 signal within pancreatic parenchyma. Spleen:  Normal size and contour. Adrenals/Urinary Tract:  Adrenal glands are normal. No hydronephrosis or perinephric stranding. Urinary bladder with smooth contours to the extent evaluated, displaced inferiorly by the gravid uterus. Stomach/Bowel: Dilated appendix with edema in the wall. Appendix with periappendiceal stranding and edema within the periappendiceal fat, appendix measuring 9 mm greatest caliber. Trace fluid and/or edema in the RIGHT lower quadrant. No additional acute bowel process to the extent evaluated. Vascular/Lymphatic: Not well evaluated in the absence of contrast. Normal caliber vessels. No adenopathy in the abdomen or pelvis. Other: Trace fluid in the RIGHT lower quadrant. No gross ascites. No abscess. Reproductive: Normal appearance of the bilateral ovaries. Gravid uterus with fetus in breech presentation. Study not performed for fetal evaluation. Musculoskeletal: No suspicious bone lesions identified. IMPRESSION: 1. Acute appendicitis. 2. While the study is not performed for fetal evaluation there is incidental note made of breech presentation of the fetus at this time with anterior placenta. The presence of acute appendicitis was discussed with the provider as outlined below. These results were  called by telephone at the time of interpretation on 09/09/2021 at 8:52 pm to provider Gavin Pound, who verbally acknowledged these results. Electronically Signed   By: Zetta Bills M.D.   On: 09/09/2021 20:52   US Abdomen Limited RUQ (LIVER/GB)  Result Date: 09/09/2021 CLINICAL DATA:  Right upper quadrant abdominal pain. Twenty-five weeks pregnant. EXAM: ULTRASOUND ABDOMEN LIMITED RIGHT UPPER QUADRANT COMPARISON:  None Available. FINDINGS: Gallbladder: No gallstones or wall thickening visualized. No sonographic Murphy sign noted by sonographer. Common bile duct: Diameter: 2 mm.  No intrahepatic biliary dilatation. Liver: No focal lesion identified. Within normal limits in parenchymal echogenicity. Portal vein is patent on color Doppler imaging with normal direction of blood flow towards the liver. Other: None. IMPRESSION: Normal right upper quadrant abdominal ultrasound. Electronically Signed   By: Richardean Sale M.D.   On: 09/09/2021 15:18    Anti-infectives: Anti-infectives (From admission, onward)    Start     Dose/Rate Route Frequency Ordered Stop   09/10/21 2200  cefTRIAXone (ROCEPHIN) 2 g in sodium chloride 0.9 % 100 mL IVPB        2 g 200 mL/hr over 30 Minutes Intravenous Every 24 hours 09/10/21 1339 09/10/21 2142   09/10/21 1430  metroNIDAZOLE (FLAGYL) IVPB 500 mg        500 mg 100 mL/hr over 60 Minutes Intravenous Every 8 hours 09/10/21 1339 09/11/21 0728   09/09/21 2200  cefTRIAXone (ROCEPHIN) 2 g in sodium chloride 0.9 % 100 mL IVPB  Status:  Discontinued  2 g 200 mL/hr over 30 Minutes Intravenous Every 24 hours 09/09/21 2148 09/10/21 1339   09/09/21 2200  metroNIDAZOLE (FLAGYL) IVPB 500 mg  Status:  Discontinued        500 mg 100 mL/hr over 60 Minutes Intravenous Every 8 hours 09/09/21 2148 09/10/21 1339        Assessment/Plan POD 1, s/p lap appy in [redacted] week pregnant patient, Dr. Redmond Pulling 6/13 -doing well -pain controlled -will switch back to oxycodone from percocet  as she is getting scheduled Tylenol at 1000mg  q 6 hrs and the percocet will cause Tylenol overdose.  -she also has some prn robaxin but hasn't taken that.  Pain is a level 3 right now so well controlled -tolerating regular diet -surgically stable for DC home.  Follow up already put in, DC instructions in AVS and d/w patient, narcotics sent to pharmacy  FEN - regular diet VTE - scds ID - none currently    LOS: 1 day    Henreitta Cea , [facility] 09/11/2021, 7:55 AM Please see Amion for pager number during day hours 7:00am-4:30pm or 7:00am -11:30am on weekends

## 2021-09-11 NOTE — Progress Notes (Signed)
[name] to bedside, pt sleeping soundly. Per RN, pt's pain is not well-controlled with current regimen. D/C'd Dilaudid and Roxicodone and started Percocet. Will update MD during shift change.   Rhea Pink, MSN, [name] 09/11/2021 5:40 AM

## 2021-09-11 NOTE — Plan of Care (Signed)

## 2021-09-11 NOTE — Discharge Summary (Signed)
Physician Discharge Summary  Patient ID: Stephanie Cooley MRN: [id number] DOB/AGE: September 01, 1995 26 y.o.  Admit date: 09/09/2021 Discharge date: 09/11/2021  Admission Diagnoses:  Discharge Diagnoses:  Principal Problem:   Appendicitis Active Problems:   [redacted] weeks gestation of pregnancy   Appendicitis, acute   Discharged Condition: good  Hospital Course: Patient presented to Maternity admissions with acute, severe abdominal pain, nausea and vomiting. MRI of the abdomen revealed acute appendicitis.  She was taken to the OR where a Laparoscopic appendectomy was performed without complication.  Patient improved markedly and by Post operative day #1 was meeting discharge criteria and was sent home with close follow up  Consults: general surgery  Significant Diagnostic Studies: radiology: IMPRESSION: 1. Acute appendicitis. 2. While the study is not performed for fetal evaluation there is incidental note made of breech presentation of the fetus at this time with anterior placenta.  Treatments: IV hydration, antibiotics: ceftriaxone and metronidazole, analgesia: acetaminophen and oxycodone, and surgery: laparoscopic appendectomy  Discharge Exam: Blood pressure 107/63, pulse 86, temperature 98 F (36.7 C), temperature source Oral, resp. rate 17, height 5\' 9"  (1.753 m), weight 90.7 kg, SpO2 97 %, unknown if currently breastfeeding. General appearance: alert, cooperative, and no distress GI: soft, gravid, appropriately tender laparoscopic incisions C/D/I Extremities: Homans sign is negative, no sign of DVT  Disposition: Discharge disposition: 01-Home or Self Care       Discharge Instructions     Discharge activity:   Complete by: As directed    No heavy lifting greater than 20 pounds for the next 3 weeks   Discharge diet:  No restrictions   Complete by: As directed    Discharge instructions   Complete by: As directed    If you have not made a follow up visit please call [facility] office at [phone] or if you have any questions or concerns.   Notify physician for a general feeling that "something is not right"   Complete by: As directed    Notify physician for increase or change in vaginal discharge   Complete by: As directed    Notify physician for intestinal cramps, with or without diarrhea, sometimes described as "gas pain"   Complete by: As directed    Notify physician for leaking of fluid   Complete by: As directed    Notify physician for low, dull backache, unrelieved by heat or Tylenol   Complete by: As directed    Notify physician for menstrual like cramps   Complete by: As directed    Notify physician for pelvic pressure   Complete by: As directed    Notify physician for uterine contractions.  These may be painless and feel like the uterus is tightening or the baby is  "balling up"   Complete by: As directed    Notify physician for vaginal bleeding   Complete by: As directed    PRETERM LABOR:  Includes any of the follwing symptoms that occur between 20 - [redacted] weeks gestation.  If these symptoms are not stopped, preterm labor can result in preterm delivery, placing your baby at risk   Complete by: As directed       Allergies as of 09/11/2021   No Known Allergies      Medication List     TAKE these medications    acetaminophen 500 MG tablet Commonly known as: TYLENOL Take 2 tablets (1,000 mg total) by mouth every 6 (six) hours as needed. What changed:  medication strength how much to take when  to take this reasons to take this   docusate sodium 100 MG capsule Commonly known as: COLACE Take 1 capsule (100 mg total) by mouth 2 (two) times daily as needed for mild constipation.   ibuprofen 600 MG tablet Commonly known as: ADVIL Take 1 tablet (600 mg total) by mouth every 6 (six) hours as needed for moderate pain. What changed:  when to take this reasons to take this   Methocarbamol 1000 MG Tabs Take 1,000 mg by mouth every 8  (eight) hours as needed for muscle spasms.   oxyCODONE 5 MG immediate release tablet Commonly known as: Oxy IR/ROXICODONE Take 1 tablet (5 mg total) by mouth every 4 (four) hours as needed for moderate pain.   polyethylene glycol 17 g packet Commonly known as: MIRALAX / GLYCOLAX Take 17 g by mouth daily as needed for moderate constipation.   prenatal multivitamin Tabs tablet Take 1 tablet by mouth at bedtime.        Follow-up Information     Maczis, Hedda Slade, PA-C Follow up on 09/26/2021.   Specialty: General Surgery Why: 4pm, please arrive 30 minutes prior to your appointment time for paperwork and check in process.  please bring insurance card and photo ID Contact information: [address] [facility] [address] [facility] [place] [address] [facility] [place] [address] [phone]                 Signed: Essie Hart 09/11/2021, 10:28 AM

## 2021-09-27 DIAGNOSIS — Z369 Encounter for antenatal screening, unspecified: Secondary | ICD-10-CM | POA: Diagnosis not present

## 2021-09-27 DIAGNOSIS — Z363 Encounter for antenatal screening for malformations: Secondary | ICD-10-CM | POA: Diagnosis not present

## 2021-09-27 DIAGNOSIS — Z3A28 28 weeks gestation of pregnancy: Secondary | ICD-10-CM | POA: Diagnosis not present

## 2021-10-25 DIAGNOSIS — M9903 Segmental and somatic dysfunction of lumbar region: Secondary | ICD-10-CM | POA: Diagnosis not present

## 2021-10-25 DIAGNOSIS — M609 Myositis, unspecified: Secondary | ICD-10-CM | POA: Diagnosis not present

## 2021-10-25 DIAGNOSIS — M5387 Other specified dorsopathies, lumbosacral region: Secondary | ICD-10-CM | POA: Diagnosis not present

## 2021-10-25 DIAGNOSIS — M9904 Segmental and somatic dysfunction of sacral region: Secondary | ICD-10-CM | POA: Diagnosis not present

## 2021-10-25 DIAGNOSIS — M9906 Segmental and somatic dysfunction of lower extremity: Secondary | ICD-10-CM | POA: Diagnosis not present

## 2021-10-25 DIAGNOSIS — G44209 Tension-type headache, unspecified, not intractable: Secondary | ICD-10-CM | POA: Diagnosis not present

## 2021-10-25 DIAGNOSIS — M545 Low back pain, unspecified: Secondary | ICD-10-CM | POA: Diagnosis not present

## 2021-10-25 DIAGNOSIS — M7060 Trochanteric bursitis, unspecified hip: Secondary | ICD-10-CM | POA: Diagnosis not present

## 2021-10-25 DIAGNOSIS — M9901 Segmental and somatic dysfunction of cervical region: Secondary | ICD-10-CM | POA: Diagnosis not present

## 2021-10-25 DIAGNOSIS — M9902 Segmental and somatic dysfunction of thoracic region: Secondary | ICD-10-CM | POA: Diagnosis not present

## 2021-10-25 DIAGNOSIS — M9905 Segmental and somatic dysfunction of pelvic region: Secondary | ICD-10-CM | POA: Diagnosis not present

## 2021-10-29 DIAGNOSIS — M9905 Segmental and somatic dysfunction of pelvic region: Secondary | ICD-10-CM | POA: Diagnosis not present

## 2021-10-29 DIAGNOSIS — M9902 Segmental and somatic dysfunction of thoracic region: Secondary | ICD-10-CM | POA: Diagnosis not present

## 2021-10-29 DIAGNOSIS — M545 Low back pain, unspecified: Secondary | ICD-10-CM | POA: Diagnosis not present

## 2021-10-29 DIAGNOSIS — M9901 Segmental and somatic dysfunction of cervical region: Secondary | ICD-10-CM | POA: Diagnosis not present

## 2021-10-29 DIAGNOSIS — M7060 Trochanteric bursitis, unspecified hip: Secondary | ICD-10-CM | POA: Diagnosis not present

## 2021-10-29 DIAGNOSIS — G44209 Tension-type headache, unspecified, not intractable: Secondary | ICD-10-CM | POA: Diagnosis not present

## 2021-10-29 DIAGNOSIS — M9903 Segmental and somatic dysfunction of lumbar region: Secondary | ICD-10-CM | POA: Diagnosis not present

## 2021-10-29 DIAGNOSIS — M5387 Other specified dorsopathies, lumbosacral region: Secondary | ICD-10-CM | POA: Diagnosis not present

## 2021-10-29 DIAGNOSIS — M609 Myositis, unspecified: Secondary | ICD-10-CM | POA: Diagnosis not present

## 2021-10-29 DIAGNOSIS — M9904 Segmental and somatic dysfunction of sacral region: Secondary | ICD-10-CM | POA: Diagnosis not present

## 2021-10-29 DIAGNOSIS — M9906 Segmental and somatic dysfunction of lower extremity: Secondary | ICD-10-CM | POA: Diagnosis not present

## 2021-10-31 DIAGNOSIS — M9903 Segmental and somatic dysfunction of lumbar region: Secondary | ICD-10-CM | POA: Diagnosis not present

## 2021-10-31 DIAGNOSIS — M545 Low back pain, unspecified: Secondary | ICD-10-CM | POA: Diagnosis not present

## 2021-10-31 DIAGNOSIS — M5387 Other specified dorsopathies, lumbosacral region: Secondary | ICD-10-CM | POA: Diagnosis not present

## 2021-10-31 DIAGNOSIS — M9906 Segmental and somatic dysfunction of lower extremity: Secondary | ICD-10-CM | POA: Diagnosis not present

## 2021-10-31 DIAGNOSIS — M7060 Trochanteric bursitis, unspecified hip: Secondary | ICD-10-CM | POA: Diagnosis not present

## 2021-10-31 DIAGNOSIS — M9901 Segmental and somatic dysfunction of cervical region: Secondary | ICD-10-CM | POA: Diagnosis not present

## 2021-10-31 DIAGNOSIS — M9905 Segmental and somatic dysfunction of pelvic region: Secondary | ICD-10-CM | POA: Diagnosis not present

## 2021-10-31 DIAGNOSIS — G44209 Tension-type headache, unspecified, not intractable: Secondary | ICD-10-CM | POA: Diagnosis not present

## 2021-10-31 DIAGNOSIS — M9904 Segmental and somatic dysfunction of sacral region: Secondary | ICD-10-CM | POA: Diagnosis not present

## 2021-10-31 DIAGNOSIS — M609 Myositis, unspecified: Secondary | ICD-10-CM | POA: Diagnosis not present

## 2021-10-31 DIAGNOSIS — M9902 Segmental and somatic dysfunction of thoracic region: Secondary | ICD-10-CM | POA: Diagnosis not present

## 2021-11-05 DIAGNOSIS — G44209 Tension-type headache, unspecified, not intractable: Secondary | ICD-10-CM | POA: Diagnosis not present

## 2021-11-05 DIAGNOSIS — M9903 Segmental and somatic dysfunction of lumbar region: Secondary | ICD-10-CM | POA: Diagnosis not present

## 2021-11-05 DIAGNOSIS — M7060 Trochanteric bursitis, unspecified hip: Secondary | ICD-10-CM | POA: Diagnosis not present

## 2021-11-05 DIAGNOSIS — M9901 Segmental and somatic dysfunction of cervical region: Secondary | ICD-10-CM | POA: Diagnosis not present

## 2021-11-05 DIAGNOSIS — M9906 Segmental and somatic dysfunction of lower extremity: Secondary | ICD-10-CM | POA: Diagnosis not present

## 2021-11-05 DIAGNOSIS — M9905 Segmental and somatic dysfunction of pelvic region: Secondary | ICD-10-CM | POA: Diagnosis not present

## 2021-11-05 DIAGNOSIS — M9904 Segmental and somatic dysfunction of sacral region: Secondary | ICD-10-CM | POA: Diagnosis not present

## 2021-11-05 DIAGNOSIS — M9902 Segmental and somatic dysfunction of thoracic region: Secondary | ICD-10-CM | POA: Diagnosis not present

## 2021-11-05 DIAGNOSIS — M609 Myositis, unspecified: Secondary | ICD-10-CM | POA: Diagnosis not present

## 2021-11-05 DIAGNOSIS — M545 Low back pain, unspecified: Secondary | ICD-10-CM | POA: Diagnosis not present

## 2021-11-05 DIAGNOSIS — M5387 Other specified dorsopathies, lumbosacral region: Secondary | ICD-10-CM | POA: Diagnosis not present

## 2021-11-08 DIAGNOSIS — M9901 Segmental and somatic dysfunction of cervical region: Secondary | ICD-10-CM | POA: Diagnosis not present

## 2021-11-08 DIAGNOSIS — M9905 Segmental and somatic dysfunction of pelvic region: Secondary | ICD-10-CM | POA: Diagnosis not present

## 2021-11-08 DIAGNOSIS — M609 Myositis, unspecified: Secondary | ICD-10-CM | POA: Diagnosis not present

## 2021-11-08 DIAGNOSIS — M9906 Segmental and somatic dysfunction of lower extremity: Secondary | ICD-10-CM | POA: Diagnosis not present

## 2021-11-08 DIAGNOSIS — M9902 Segmental and somatic dysfunction of thoracic region: Secondary | ICD-10-CM | POA: Diagnosis not present

## 2021-11-08 DIAGNOSIS — M7060 Trochanteric bursitis, unspecified hip: Secondary | ICD-10-CM | POA: Diagnosis not present

## 2021-11-08 DIAGNOSIS — M9903 Segmental and somatic dysfunction of lumbar region: Secondary | ICD-10-CM | POA: Diagnosis not present

## 2021-11-08 DIAGNOSIS — M9904 Segmental and somatic dysfunction of sacral region: Secondary | ICD-10-CM | POA: Diagnosis not present

## 2021-11-08 DIAGNOSIS — M5387 Other specified dorsopathies, lumbosacral region: Secondary | ICD-10-CM | POA: Diagnosis not present

## 2021-11-08 DIAGNOSIS — M545 Low back pain, unspecified: Secondary | ICD-10-CM | POA: Diagnosis not present

## 2021-11-08 DIAGNOSIS — G44209 Tension-type headache, unspecified, not intractable: Secondary | ICD-10-CM | POA: Diagnosis not present

## 2021-11-11 DIAGNOSIS — M9905 Segmental and somatic dysfunction of pelvic region: Secondary | ICD-10-CM | POA: Diagnosis not present

## 2021-11-11 DIAGNOSIS — M7060 Trochanteric bursitis, unspecified hip: Secondary | ICD-10-CM | POA: Diagnosis not present

## 2021-11-11 DIAGNOSIS — M609 Myositis, unspecified: Secondary | ICD-10-CM | POA: Diagnosis not present

## 2021-11-11 DIAGNOSIS — M5387 Other specified dorsopathies, lumbosacral region: Secondary | ICD-10-CM | POA: Diagnosis not present

## 2021-11-11 DIAGNOSIS — M9902 Segmental and somatic dysfunction of thoracic region: Secondary | ICD-10-CM | POA: Diagnosis not present

## 2021-11-11 DIAGNOSIS — M9906 Segmental and somatic dysfunction of lower extremity: Secondary | ICD-10-CM | POA: Diagnosis not present

## 2021-11-11 DIAGNOSIS — M9901 Segmental and somatic dysfunction of cervical region: Secondary | ICD-10-CM | POA: Diagnosis not present

## 2021-11-11 DIAGNOSIS — M9903 Segmental and somatic dysfunction of lumbar region: Secondary | ICD-10-CM | POA: Diagnosis not present

## 2021-11-11 DIAGNOSIS — M545 Low back pain, unspecified: Secondary | ICD-10-CM | POA: Diagnosis not present

## 2021-11-11 DIAGNOSIS — G44209 Tension-type headache, unspecified, not intractable: Secondary | ICD-10-CM | POA: Diagnosis not present

## 2021-11-11 DIAGNOSIS — M9904 Segmental and somatic dysfunction of sacral region: Secondary | ICD-10-CM | POA: Diagnosis not present

## 2021-11-18 DIAGNOSIS — G44209 Tension-type headache, unspecified, not intractable: Secondary | ICD-10-CM | POA: Diagnosis not present

## 2021-11-18 DIAGNOSIS — M9905 Segmental and somatic dysfunction of pelvic region: Secondary | ICD-10-CM | POA: Diagnosis not present

## 2021-11-18 DIAGNOSIS — M9901 Segmental and somatic dysfunction of cervical region: Secondary | ICD-10-CM | POA: Diagnosis not present

## 2021-11-18 DIAGNOSIS — M9902 Segmental and somatic dysfunction of thoracic region: Secondary | ICD-10-CM | POA: Diagnosis not present

## 2021-11-18 DIAGNOSIS — M9903 Segmental and somatic dysfunction of lumbar region: Secondary | ICD-10-CM | POA: Diagnosis not present

## 2021-11-18 DIAGNOSIS — M9904 Segmental and somatic dysfunction of sacral region: Secondary | ICD-10-CM | POA: Diagnosis not present

## 2021-11-18 DIAGNOSIS — M545 Low back pain, unspecified: Secondary | ICD-10-CM | POA: Diagnosis not present

## 2021-11-18 DIAGNOSIS — M5387 Other specified dorsopathies, lumbosacral region: Secondary | ICD-10-CM | POA: Diagnosis not present

## 2021-11-18 DIAGNOSIS — M609 Myositis, unspecified: Secondary | ICD-10-CM | POA: Diagnosis not present

## 2021-11-18 DIAGNOSIS — M7060 Trochanteric bursitis, unspecified hip: Secondary | ICD-10-CM | POA: Diagnosis not present

## 2021-11-18 DIAGNOSIS — M9906 Segmental and somatic dysfunction of lower extremity: Secondary | ICD-10-CM | POA: Diagnosis not present

## 2021-11-26 DIAGNOSIS — M9903 Segmental and somatic dysfunction of lumbar region: Secondary | ICD-10-CM | POA: Diagnosis not present

## 2021-11-26 DIAGNOSIS — M9902 Segmental and somatic dysfunction of thoracic region: Secondary | ICD-10-CM | POA: Diagnosis not present

## 2021-11-26 DIAGNOSIS — G44209 Tension-type headache, unspecified, not intractable: Secondary | ICD-10-CM | POA: Diagnosis not present

## 2021-11-26 DIAGNOSIS — M9901 Segmental and somatic dysfunction of cervical region: Secondary | ICD-10-CM | POA: Diagnosis not present

## 2021-11-26 DIAGNOSIS — M7060 Trochanteric bursitis, unspecified hip: Secondary | ICD-10-CM | POA: Diagnosis not present

## 2021-11-26 DIAGNOSIS — M545 Low back pain, unspecified: Secondary | ICD-10-CM | POA: Diagnosis not present

## 2021-11-26 DIAGNOSIS — M9906 Segmental and somatic dysfunction of lower extremity: Secondary | ICD-10-CM | POA: Diagnosis not present

## 2021-11-26 DIAGNOSIS — M9904 Segmental and somatic dysfunction of sacral region: Secondary | ICD-10-CM | POA: Diagnosis not present

## 2021-11-26 DIAGNOSIS — M609 Myositis, unspecified: Secondary | ICD-10-CM | POA: Diagnosis not present

## 2021-11-26 DIAGNOSIS — M9905 Segmental and somatic dysfunction of pelvic region: Secondary | ICD-10-CM | POA: Diagnosis not present

## 2021-11-26 DIAGNOSIS — M5387 Other specified dorsopathies, lumbosacral region: Secondary | ICD-10-CM | POA: Diagnosis not present

## 2021-11-28 DIAGNOSIS — M5387 Other specified dorsopathies, lumbosacral region: Secondary | ICD-10-CM | POA: Diagnosis not present

## 2021-11-28 DIAGNOSIS — M9905 Segmental and somatic dysfunction of pelvic region: Secondary | ICD-10-CM | POA: Diagnosis not present

## 2021-11-28 DIAGNOSIS — M9903 Segmental and somatic dysfunction of lumbar region: Secondary | ICD-10-CM | POA: Diagnosis not present

## 2021-11-28 DIAGNOSIS — M545 Low back pain, unspecified: Secondary | ICD-10-CM | POA: Diagnosis not present

## 2021-11-28 DIAGNOSIS — M9904 Segmental and somatic dysfunction of sacral region: Secondary | ICD-10-CM | POA: Diagnosis not present

## 2021-11-28 DIAGNOSIS — M9902 Segmental and somatic dysfunction of thoracic region: Secondary | ICD-10-CM | POA: Diagnosis not present

## 2021-11-28 DIAGNOSIS — M9906 Segmental and somatic dysfunction of lower extremity: Secondary | ICD-10-CM | POA: Diagnosis not present

## 2021-11-28 DIAGNOSIS — G44209 Tension-type headache, unspecified, not intractable: Secondary | ICD-10-CM | POA: Diagnosis not present

## 2021-11-28 DIAGNOSIS — M7060 Trochanteric bursitis, unspecified hip: Secondary | ICD-10-CM | POA: Diagnosis not present

## 2021-11-28 DIAGNOSIS — M9901 Segmental and somatic dysfunction of cervical region: Secondary | ICD-10-CM | POA: Diagnosis not present

## 2021-11-28 DIAGNOSIS — M609 Myositis, unspecified: Secondary | ICD-10-CM | POA: Diagnosis not present

## 2021-11-28 LAB — OB RESULTS CONSOLE GBS: GBS: POSITIVE

## 2021-12-03 DIAGNOSIS — M9904 Segmental and somatic dysfunction of sacral region: Secondary | ICD-10-CM | POA: Diagnosis not present

## 2021-12-03 DIAGNOSIS — M9902 Segmental and somatic dysfunction of thoracic region: Secondary | ICD-10-CM | POA: Diagnosis not present

## 2021-12-03 DIAGNOSIS — M609 Myositis, unspecified: Secondary | ICD-10-CM | POA: Diagnosis not present

## 2021-12-03 DIAGNOSIS — M9905 Segmental and somatic dysfunction of pelvic region: Secondary | ICD-10-CM | POA: Diagnosis not present

## 2021-12-03 DIAGNOSIS — M5387 Other specified dorsopathies, lumbosacral region: Secondary | ICD-10-CM | POA: Diagnosis not present

## 2021-12-03 DIAGNOSIS — G44209 Tension-type headache, unspecified, not intractable: Secondary | ICD-10-CM | POA: Diagnosis not present

## 2021-12-03 DIAGNOSIS — M9903 Segmental and somatic dysfunction of lumbar region: Secondary | ICD-10-CM | POA: Diagnosis not present

## 2021-12-03 DIAGNOSIS — M7060 Trochanteric bursitis, unspecified hip: Secondary | ICD-10-CM | POA: Diagnosis not present

## 2021-12-03 DIAGNOSIS — M9906 Segmental and somatic dysfunction of lower extremity: Secondary | ICD-10-CM | POA: Diagnosis not present

## 2021-12-03 DIAGNOSIS — M9901 Segmental and somatic dysfunction of cervical region: Secondary | ICD-10-CM | POA: Diagnosis not present

## 2021-12-03 DIAGNOSIS — M545 Low back pain, unspecified: Secondary | ICD-10-CM | POA: Diagnosis not present

## 2021-12-06 DIAGNOSIS — M9906 Segmental and somatic dysfunction of lower extremity: Secondary | ICD-10-CM | POA: Diagnosis not present

## 2021-12-06 DIAGNOSIS — M9904 Segmental and somatic dysfunction of sacral region: Secondary | ICD-10-CM | POA: Diagnosis not present

## 2021-12-06 DIAGNOSIS — M609 Myositis, unspecified: Secondary | ICD-10-CM | POA: Diagnosis not present

## 2021-12-06 DIAGNOSIS — M7060 Trochanteric bursitis, unspecified hip: Secondary | ICD-10-CM | POA: Diagnosis not present

## 2021-12-06 DIAGNOSIS — M545 Low back pain, unspecified: Secondary | ICD-10-CM | POA: Diagnosis not present

## 2021-12-06 DIAGNOSIS — M9902 Segmental and somatic dysfunction of thoracic region: Secondary | ICD-10-CM | POA: Diagnosis not present

## 2021-12-06 DIAGNOSIS — G44209 Tension-type headache, unspecified, not intractable: Secondary | ICD-10-CM | POA: Diagnosis not present

## 2021-12-06 DIAGNOSIS — M5387 Other specified dorsopathies, lumbosacral region: Secondary | ICD-10-CM | POA: Diagnosis not present

## 2021-12-06 DIAGNOSIS — M9905 Segmental and somatic dysfunction of pelvic region: Secondary | ICD-10-CM | POA: Diagnosis not present

## 2021-12-06 DIAGNOSIS — M9901 Segmental and somatic dysfunction of cervical region: Secondary | ICD-10-CM | POA: Diagnosis not present

## 2021-12-06 DIAGNOSIS — M9903 Segmental and somatic dysfunction of lumbar region: Secondary | ICD-10-CM | POA: Diagnosis not present

## 2021-12-10 DIAGNOSIS — G44209 Tension-type headache, unspecified, not intractable: Secondary | ICD-10-CM | POA: Diagnosis not present

## 2021-12-10 DIAGNOSIS — M5387 Other specified dorsopathies, lumbosacral region: Secondary | ICD-10-CM | POA: Diagnosis not present

## 2021-12-10 DIAGNOSIS — M9905 Segmental and somatic dysfunction of pelvic region: Secondary | ICD-10-CM | POA: Diagnosis not present

## 2021-12-10 DIAGNOSIS — M545 Low back pain, unspecified: Secondary | ICD-10-CM | POA: Diagnosis not present

## 2021-12-10 DIAGNOSIS — M9903 Segmental and somatic dysfunction of lumbar region: Secondary | ICD-10-CM | POA: Diagnosis not present

## 2021-12-10 DIAGNOSIS — M9901 Segmental and somatic dysfunction of cervical region: Secondary | ICD-10-CM | POA: Diagnosis not present

## 2021-12-10 DIAGNOSIS — M7060 Trochanteric bursitis, unspecified hip: Secondary | ICD-10-CM | POA: Diagnosis not present

## 2021-12-10 DIAGNOSIS — M609 Myositis, unspecified: Secondary | ICD-10-CM | POA: Diagnosis not present

## 2021-12-10 DIAGNOSIS — M9906 Segmental and somatic dysfunction of lower extremity: Secondary | ICD-10-CM | POA: Diagnosis not present

## 2021-12-10 DIAGNOSIS — M9902 Segmental and somatic dysfunction of thoracic region: Secondary | ICD-10-CM | POA: Diagnosis not present

## 2021-12-10 DIAGNOSIS — M9904 Segmental and somatic dysfunction of sacral region: Secondary | ICD-10-CM | POA: Diagnosis not present

## 2021-12-16 ENCOUNTER — Other Ambulatory Visit: Payer: Self-pay | Admitting: Obstetrics and Gynecology

## 2021-12-17 DIAGNOSIS — M9905 Segmental and somatic dysfunction of pelvic region: Secondary | ICD-10-CM | POA: Diagnosis not present

## 2021-12-17 DIAGNOSIS — M9903 Segmental and somatic dysfunction of lumbar region: Secondary | ICD-10-CM | POA: Diagnosis not present

## 2021-12-17 DIAGNOSIS — M7060 Trochanteric bursitis, unspecified hip: Secondary | ICD-10-CM | POA: Diagnosis not present

## 2021-12-17 DIAGNOSIS — M5387 Other specified dorsopathies, lumbosacral region: Secondary | ICD-10-CM | POA: Diagnosis not present

## 2021-12-17 DIAGNOSIS — M9901 Segmental and somatic dysfunction of cervical region: Secondary | ICD-10-CM | POA: Diagnosis not present

## 2021-12-17 DIAGNOSIS — M9902 Segmental and somatic dysfunction of thoracic region: Secondary | ICD-10-CM | POA: Diagnosis not present

## 2021-12-17 DIAGNOSIS — G44209 Tension-type headache, unspecified, not intractable: Secondary | ICD-10-CM | POA: Diagnosis not present

## 2021-12-17 DIAGNOSIS — M609 Myositis, unspecified: Secondary | ICD-10-CM | POA: Diagnosis not present

## 2021-12-17 DIAGNOSIS — M9904 Segmental and somatic dysfunction of sacral region: Secondary | ICD-10-CM | POA: Diagnosis not present

## 2021-12-17 DIAGNOSIS — M9906 Segmental and somatic dysfunction of lower extremity: Secondary | ICD-10-CM | POA: Diagnosis not present

## 2021-12-17 DIAGNOSIS — M545 Low back pain, unspecified: Secondary | ICD-10-CM | POA: Diagnosis not present

## 2021-12-18 ENCOUNTER — Encounter (HOSPITAL_COMMUNITY): Payer: Self-pay | Admitting: *Deleted

## 2021-12-18 ENCOUNTER — Telehealth (HOSPITAL_COMMUNITY): Payer: Self-pay | Admitting: *Deleted

## 2021-12-18 NOTE — Telephone Encounter (Signed)
Preadmission screen  

## 2021-12-19 ENCOUNTER — Telehealth (HOSPITAL_COMMUNITY): Payer: Self-pay | Admitting: *Deleted

## 2021-12-19 ENCOUNTER — Encounter (HOSPITAL_COMMUNITY): Payer: Self-pay | Admitting: *Deleted

## 2021-12-19 NOTE — Telephone Encounter (Signed)
Preadmission screen  

## 2021-12-22 ENCOUNTER — Inpatient Hospital Stay (HOSPITAL_COMMUNITY)
Admission: AD | Admit: 2021-12-22 | Discharge: 2021-12-23 | DRG: 807 | Disposition: A | Discharge status: Home / Self Care | Payer: 59 | Attending: Obstetrics and Gynecology | Admitting: Obstetrics and Gynecology

## 2021-12-22 ENCOUNTER — Inpatient Hospital Stay (HOSPITAL_COMMUNITY): Payer: 59 | Admitting: Anesthesiology

## 2021-12-22 ENCOUNTER — Encounter (HOSPITAL_COMMUNITY): Payer: Self-pay | Admitting: Obstetrics and Gynecology

## 2021-12-22 DIAGNOSIS — O411230 Chorioamnionitis, third trimester, not applicable or unspecified: Secondary | ICD-10-CM | POA: Diagnosis not present

## 2021-12-22 DIAGNOSIS — Z3A40 40 weeks gestation of pregnancy: Secondary | ICD-10-CM

## 2021-12-22 DIAGNOSIS — O99824 Streptococcus B carrier state complicating childbirth: Secondary | ICD-10-CM | POA: Diagnosis present

## 2021-12-22 DIAGNOSIS — O26893 Other specified pregnancy related conditions, third trimester: Secondary | ICD-10-CM | POA: Diagnosis not present

## 2021-12-22 DIAGNOSIS — O480 Post-term pregnancy: Principal | ICD-10-CM | POA: Diagnosis present

## 2021-12-22 DIAGNOSIS — Z23 Encounter for immunization: Secondary | ICD-10-CM | POA: Diagnosis not present

## 2021-12-22 LAB — CBC
HCT: 37.7 % (ref 36.0–46.0)
Hemoglobin: 12 g/dL (ref 12.0–15.0)
MCH: 26.3 pg (ref 26.0–34.0)
MCHC: 31.8 g/dL (ref 30.0–36.0)
MCV: 82.5 fL (ref 80.0–100.0)
Platelets: 291 10*3/uL (ref 150–400)
RBC: 4.57 MIL/uL (ref 3.87–5.11)
RDW: 16.2 % — ABNORMAL HIGH (ref 11.5–15.5)
WBC: 10.9 10*3/uL — ABNORMAL HIGH (ref 4.0–10.5)
nRBC: 0 % (ref 0.0–0.2)

## 2021-12-22 LAB — TYPE AND SCREEN
ABO/RH(D): A POS
Antibody Screen: NEGATIVE

## 2021-12-22 LAB — RPR: RPR Ser Ql: NONREACTIVE

## 2021-12-22 MED ORDER — MISOPROSTOL 25 MCG QUARTER TABLET: 25.0000 ug | ORAL_TABLET | Freq: Once | ORAL | Status: DC

## 2021-12-22 MED ORDER — ONDANSETRON HCL 4 MG/2ML IJ SOLN: 4.0000 mg | INTRAMUSCULAR | Status: DC | PRN

## 2021-12-22 MED ORDER — ACETAMINOPHEN 325 MG PO TABS
650.0000 mg | ORAL_TABLET | ORAL | Status: DC | PRN
  Administered 2021-12-23: 650 mg via ORAL
  Filled 2021-12-22: qty 2

## 2021-12-22 MED ORDER — IBUPROFEN 600 MG PO TABS
600.0000 mg | ORAL_TABLET | Freq: Four times a day (QID) | ORAL | Status: DC
  Administered 2021-12-23 (×3): 600 mg via ORAL
  Filled 2021-12-22 (×3): qty 1

## 2021-12-22 MED ORDER — DIPHENHYDRAMINE HCL 25 MG PO CAPS: 25.0000 mg | ORAL_CAPSULE | Freq: Four times a day (QID) | ORAL | Status: DC | PRN

## 2021-12-22 MED ORDER — DIBUCAINE (PERIANAL) 1 % EX OINT: 1.0000 | TOPICAL_OINTMENT | CUTANEOUS | Status: DC | PRN

## 2021-12-22 MED ORDER — ACETAMINOPHEN 325 MG PO TABS
650.0000 mg | ORAL_TABLET | ORAL | Status: DC | PRN
  Administered 2021-12-22 (×2): 650 mg via ORAL
  Filled 2021-12-22 (×2): qty 2

## 2021-12-22 MED ORDER — EPHEDRINE 5 MG/ML INJ: 10.0000 mg | INTRAVENOUS | Status: DC | PRN

## 2021-12-22 MED ORDER — TERBUTALINE SULFATE 1 MG/ML IJ SOLN: 0.2500 mg | Freq: Once | INTRAMUSCULAR | Status: DC | PRN

## 2021-12-22 MED ORDER — LACTATED RINGERS IV SOLN: 500.0000 mL | Freq: Once | INTRAVENOUS | Status: DC

## 2021-12-22 MED ORDER — FENTANYL CITRATE (PF) 100 MCG/2ML IJ SOLN
INTRAMUSCULAR | Status: AC
  Filled 2021-12-22: qty 2

## 2021-12-22 MED ORDER — LACTATED RINGERS IV SOLN
500.0000 mL | Freq: Once | INTRAVENOUS | Status: AC
  Administered 2021-12-22: 500 mL via INTRAVENOUS

## 2021-12-22 MED ORDER — FENTANYL CITRATE (PF) 100 MCG/2ML IJ SOLN
100.0000 ug | INTRAMUSCULAR | Status: DC | PRN
  Administered 2021-12-22: 100 ug via INTRAVENOUS

## 2021-12-22 MED ORDER — LIDOCAINE HCL (PF) 1 % IJ SOLN
INTRAMUSCULAR | Status: DC | PRN
  Administered 2021-12-22: 2 mL via EPIDURAL
  Administered 2021-12-22: 3 mL via EPIDURAL
  Administered 2021-12-22: 5 mL via EPIDURAL

## 2021-12-22 MED ORDER — WITCH HAZEL-GLYCERIN EX PADS: 1.0000 | MEDICATED_PAD | CUTANEOUS | Status: DC | PRN

## 2021-12-22 MED ORDER — PHENYLEPHRINE 80 MCG/ML (10ML) SYRINGE FOR IV PUSH (FOR BLOOD PRESSURE SUPPORT): 80.0000 ug | PREFILLED_SYRINGE | INTRAVENOUS | Status: DC | PRN

## 2021-12-22 MED ORDER — ONDANSETRON HCL 4 MG PO TABS: 4.0000 mg | ORAL_TABLET | ORAL | Status: DC | PRN

## 2021-12-22 MED ORDER — SOD CITRATE-CITRIC ACID 500-334 MG/5ML PO SOLN
30.0000 mL | ORAL | Status: DC | PRN
  Administered 2021-12-22: 30 mL via ORAL
  Filled 2021-12-22: qty 30

## 2021-12-22 MED ORDER — SODIUM CHLORIDE 0.9 % IV SOLN
5.0000 10*6.[IU] | Freq: Once | INTRAVENOUS | Status: AC
  Administered 2021-12-22: 5 10*6.[IU] via INTRAVENOUS
  Filled 2021-12-22: qty 5

## 2021-12-22 MED ORDER — OXYTOCIN-SODIUM CHLORIDE 30-0.9 UT/500ML-% IV SOLN
2.5000 [IU]/h | INTRAVENOUS | Status: DC
  Administered 2021-12-22: 2.5 [IU]/h via INTRAVENOUS
  Filled 2021-12-22: qty 500

## 2021-12-22 MED ORDER — OXYTOCIN BOLUS FROM INFUSION
333.0000 mL | Freq: Once | INTRAVENOUS | Status: AC
  Administered 2021-12-22: 333 mL via INTRAVENOUS

## 2021-12-22 MED ORDER — LACTATED RINGERS IV SOLN: 500.0000 mL | INTRAVENOUS | Status: DC | PRN

## 2021-12-22 MED ORDER — FENTANYL-BUPIVACAINE-NACL 0.5-0.125-0.9 MG/250ML-% EP SOLN
12.0000 mL/h | EPIDURAL | Status: DC | PRN
  Administered 2021-12-22: 12 mL/h via EPIDURAL
  Filled 2021-12-22: qty 250

## 2021-12-22 MED ORDER — SENNOSIDES-DOCUSATE SODIUM 8.6-50 MG PO TABS
2.0000 | ORAL_TABLET | Freq: Every day | ORAL | Status: DC
  Administered 2021-12-23: 2 via ORAL
  Filled 2021-12-22: qty 2

## 2021-12-22 MED ORDER — IBUPROFEN 600 MG PO TABS
600.0000 mg | ORAL_TABLET | Freq: Once | ORAL | Status: AC
  Administered 2021-12-22: 600 mg via ORAL
  Filled 2021-12-22: qty 1

## 2021-12-22 MED ORDER — PRENATAL MULTIVITAMIN CH
1.0000 | ORAL_TABLET | Freq: Every day | ORAL | Status: DC
  Administered 2021-12-23: 1 via ORAL
  Filled 2021-12-22: qty 1

## 2021-12-22 MED ORDER — DIPHENHYDRAMINE HCL 50 MG/ML IJ SOLN
12.5000 mg | INTRAMUSCULAR | Status: DC | PRN
  Administered 2021-12-22 (×2): 12.5 mg via INTRAVENOUS
  Filled 2021-12-22 (×2): qty 1

## 2021-12-22 MED ORDER — LIDOCAINE HCL (PF) 1 % IJ SOLN: 30.0000 mL | INTRAMUSCULAR | Status: DC | PRN

## 2021-12-22 MED ORDER — PHENYLEPHRINE 80 MCG/ML (10ML) SYRINGE FOR IV PUSH (FOR BLOOD PRESSURE SUPPORT)
80.0000 ug | PREFILLED_SYRINGE | INTRAVENOUS | Status: DC | PRN
  Filled 2021-12-22: qty 10

## 2021-12-22 MED ORDER — LACTATED RINGERS IV SOLN: INTRAVENOUS | Status: DC

## 2021-12-22 MED ORDER — OXYTOCIN-SODIUM CHLORIDE 30-0.9 UT/500ML-% IV SOLN
1.0000 m[IU]/min | INTRAVENOUS | Status: DC
  Administered 2021-12-22: 2 m[IU]/min via INTRAVENOUS

## 2021-12-22 MED ORDER — ONDANSETRON HCL 4 MG/2ML IJ SOLN: 4.0000 mg | Freq: Four times a day (QID) | INTRAMUSCULAR | Status: DC | PRN

## 2021-12-22 MED ORDER — BENZOCAINE-MENTHOL 20-0.5 % EX AERO
1.0000 | INHALATION_SPRAY | CUTANEOUS | Status: DC | PRN
  Filled 2021-12-22: qty 56

## 2021-12-22 MED ORDER — ZOLPIDEM TARTRATE 5 MG PO TABS: 5.0000 mg | ORAL_TABLET | Freq: Every evening | ORAL | Status: DC | PRN

## 2021-12-22 MED ORDER — PENICILLIN G POT IN DEXTROSE 60000 UNIT/ML IV SOLN
3.0000 10*6.[IU] | INTRAVENOUS | Status: DC
  Administered 2021-12-22 (×2): 3 10*6.[IU] via INTRAVENOUS
  Filled 2021-12-22 (×2): qty 50

## 2021-12-22 MED ORDER — TETANUS-DIPHTH-ACELL PERTUSSIS 5-2.5-18.5 LF-MCG/0.5 IM SUSY: 0.5000 mL | PREFILLED_SYRINGE | Freq: Once | INTRAMUSCULAR | Status: DC

## 2021-12-22 MED ORDER — COCONUT OIL OIL: 1.0000 | TOPICAL_OIL | Status: DC | PRN

## 2021-12-22 MED ORDER — SIMETHICONE 80 MG PO CHEW: 80.0000 mg | CHEWABLE_TABLET | ORAL | Status: DC | PRN

## 2021-12-22 NOTE — Plan of Care (Signed)
Pt demonstrated understanding 

## 2021-12-22 NOTE — H&P (Signed)
Stephanie Cooley is Stephanie 26 y.o. female presenting for labor. Contractions were every 5  min apart.  No LOF or VB . Good FM History OB History     Gravida  2   Para  1   Term  1   Preterm      AB      Living  1      SAB      IAB      Ectopic      Multiple  0   Live Births  1          Past Medical History:  Diagnosis Date   Medical history non-contributory    Past Surgical History:  Procedure Laterality Date   FOOT SURGERY     LAPAROSCOPIC APPENDECTOMY N/Stephanie 09/10/2021   Procedure: APPENDECTOMY LAPAROSCOPIC;  Surgeon: Stephanie Pickerel, MD;  Location: [facility];  Service: General;  Laterality: N/Stephanie;   widsom teeth     Family History: family history is not on file. Social History:  reports that she has never smoked. She has never used smokeless tobacco. She reports that she does not currently use alcohol. She reports that she does not use drugs.  Review of Systems -  see abive  Dilation: 10 Effacement (%): 100 Station: Plus 1 Exam by:: Stephanie Grindstaff RN Blood pressure 114/77, pulse 90, temperature 98 F (36.7 C), temperature source Oral, resp. rate 20, height 5\' 9"  (1.753 m), weight 102.5 kg, SpO2 97 %, unknown if currently breastfeeding. Exam Physical Exam  Physical Examination: General appearance - alert, well appearing, and in no distress Mental status - alert, oriented to person, place, and time Chest - clear to auscultation, no wheezes, rales or rhonchi, symmetric air entry Heart - normal rate and regular rhythm Abdomen - soft, nontender, nondistended, no masses or organomegaly Extremities - Homan'Stephanie sign negative bilaterally  Prenatal labs: ABO, Rh: --/--/Stephanie POS (09/24 0145) Antibody: NEG (09/24 0145) Rubella: Immune (03/02 0000) RPR: NON REACTIVE (09/24 0145)  HBsAg: Negative (03/02 0000)  HIV: Non-reactive (03/02 0000)  GBS: Positive/-- (08/31 0000)   Assessment/Plan: Active labor Epidural contractions slowed after.  PT had AROM and pitocin  augmentation Anticipate SVD AROM clear PCN for GBS   Stephanie Cooley Stephanie Cooley 12/22/2021, 3:24 PM

## 2021-12-22 NOTE — MAU Note (Signed)
..  Stephanie Cooley is a 26 y.o. at [redacted]w[redacted]d here in MAU reporting: contractions since 9:30 pm that are now every 4 minutes. +FM. Denies vaginal bleeding or leaking of fluid.   Pain score: 8/10  SVX:[id number] in room 140's

## 2021-12-22 NOTE — Anesthesia Procedure Notes (Signed)
Epidural Patient location during procedure: OB Start time: 12/22/2021 3:05 AM End time: 12/22/2021 3:12 AM  Staffing Anesthesiologist: Suzette Battiest, MD Performed: anesthesiologist   Preanesthetic Checklist Completed: patient identified, IV checked, site marked, risks and benefits discussed, surgical consent, monitors and equipment checked, pre-op evaluation and timeout performed  Epidural Patient position: sitting Prep: DuraPrep and site prepped and draped Patient monitoring: continuous pulse ox and blood pressure Approach: midline Location: L3-L4 Injection technique: LOR air  Needle:  Needle type: Tuohy  Needle gauge: 17 G Needle length: 9 cm and 9 Needle insertion depth: 5 cm cm Catheter type: closed end flexible Catheter size: 19 Gauge Catheter at skin depth: 10 cm Test dose: negative  Assessment Events: blood not aspirated, injection not painful, no injection resistance, no paresthesia and negative IV test

## 2021-12-22 NOTE — MAU Provider Note (Signed)
None     S Ms. Stephanie Cooley is a 26 y.o. G2P1001 patient who presents to MAU today with complaint of painful contractions q 4 minutes. Pain score 8/10.  O BP 131/76 (BP Location: Right Arm)   Pulse 97   Temp 98.3 F (36.8 C) (Oral)   Resp 20   Ht 5\' 9"  (1.753 m)   Wt 102.5 kg   SpO2 100%   BMI 33.36 kg/m    A Tracing reactive after 25 min [redacted]w[redacted]d  GBS+ 4/70/-3 on initial exam by RN Given GA and dilation, [name] called Dr. Charlesetta Garibaldi after initial assessment  P Per Dr. Charlesetta Garibaldi, admit to L&D  Darlina Rumpf, [name] 12/22/2021 2:27 AM

## 2021-12-22 NOTE — Lactation Note (Signed)
This note was copied from a baby's chart. Lactation Consultation Note  Patient Name: Stephanie Cooley [id number] Date: 12/22/2021   Age:27 hours Attempted to see mom. Mom sleeping. Waved at FOB who was awake.  Maternal Data    Feeding    LATCH Score                    Lactation Tools Discussed/Used    Interventions    Discharge    Consult Status      Theodoro Kalata 12/22/2021, 8:17 PM

## 2021-12-22 NOTE — Lactation Note (Signed)
This note was copied from a baby's chart. Lactation Consultation Note  Patient Name: Stephanie Cooley ZESPQ'Z Date: 12/22/2021 Reason for consult: L&D Initial assessment;Term;Breastfeeding assistance Age:27 hours  P2, Term, Infant Female  LC entered the room and the infant was STS with the birth parent.  LC assisted the parent with latching the infant to her left breast in the cross-cradle position.  The infant latched, but the birth parent stated that she was feeling some pinching.  LC flipped her upper lip and pulled down on her chin.  The infant latched deeply, tongue was down, lips were flanged, sucking was rhythmic, and some swallows were noted.  The birth parent stated that the latch was comfortable.  The parents are aware that they will be seen on the [facility].  LC left the room to give the family time for bonding.   Maternal Data    Feeding Mother's Current Feeding Choice: Breast Milk  LATCH Score Latch: Grasps breast easily, tongue down, lips flanged, rhythmical sucking.  Audible Swallowing: A few with stimulation  Type of Nipple: Everted at rest and after stimulation  Comfort (Breast/Nipple): Soft / non-tender  Hold (Positioning): Assistance needed to correctly position infant at breast and maintain latch.  LATCH Score: 8   Lactation Tools Discussed/Used    Interventions Interventions: Assisted with latch;Support pillows;Adjust position  Discharge    Consult Status Consult Status: Follow-up from L&D Date: 12/22/21 Follow-up type: In-patient    Lysbeth Penner 12/22/2021, 3:06 PM

## 2021-12-22 NOTE — Anesthesia Preprocedure Evaluation (Signed)
Anesthesia Evaluation  Patient identified by MRN, date of birth, ID band Patient awake    Reviewed: Allergy & Precautions, Patient's Chart, lab work & pertinent test results  Airway Mallampati: II  TM Distance: >3 FB Neck ROM: Full    Dental   Pulmonary neg pulmonary ROS,    breath sounds clear to auscultation       Cardiovascular negative cardio ROS   Rhythm:Regular Rate:Normal     Neuro/Psych negative neurological ROS     GI/Hepatic negative GI ROS, Neg liver ROS,   Endo/Other  negative endocrine ROS  Renal/GU negative Renal ROS     Musculoskeletal   Abdominal   Peds  Hematology negative hematology ROS (+)   Anesthesia Other Findings   Reproductive/Obstetrics (+) Pregnancy                             Anesthesia Physical Anesthesia Plan  ASA: 2  Anesthesia Plan: Epidural   Post-op Pain Management:    Induction:   PONV Risk Score and Plan: Treatment may vary due to age or medical condition  Airway Management Planned: Natural Airway  Additional Equipment:   Intra-op Plan:   Post-operative Plan:   Informed Consent: I have reviewed the patients History and Physical, chart, labs and discussed the procedure including the risks, benefits and alternatives for the proposed anesthesia with the patient or authorized representative who has indicated his/her understanding and acceptance.       Plan Discussed with:   Anesthesia Plan Comments:         Anesthesia Quick Evaluation

## 2021-12-22 NOTE — Lactation Note (Signed)
This note was copied from a baby's chart. Lactation Consultation Note  Patient Name: Stephanie Cooley [id number] Date: 12/22/2021 Reason for consult: Initial assessment;Term Age:27 hours Experienced BF mom stated that the baby has BF well X 2 but not interested in BF for this feeding so mom is using hand pump and spoon feeding a few drops of colostrum giving it to baby. Newborn feeding habits, behavior, STS, I&O positions, support reviewed. Mom encouraged to feed baby 8-12 times/24 hours and with feeding cues.   Encouraged mom to call for latch assistance or questions as needed.  Maternal Data Has patient been taught Hand Expression?: Yes Does the patient have breastfeeding experience prior to this delivery?: Yes How long did the patient breastfeed?: 5 months  Feeding    LATCH Score Latch: Too sleepy or reluctant, no latch achieved, no sucking elicited.  Audible Swallowing: None  Type of Nipple: Everted at rest and after stimulation (short shaft/compressible)  Comfort (Breast/Nipple): Filling, red/small blisters or bruises, mild/mod discomfort (redness)  Hold (Positioning): No assistance needed to correctly position infant at breast.  LATCH Score: 5   Lactation Tools Discussed/Used Tools: Pump Breast pump type: Manual Pump Education: Milk Storage Reason for Pumping: mom using hand pump for pre-pumping and give colostrum  Interventions Interventions: Breast feeding basics reviewed;Position options;Skin to skin;Hand express;LC Services brochure;Pre-pump if needed;Breast compression;Hand pump  Discharge    Consult Status Consult Status: Follow-up Date: 12/23/21 Follow-up type: In-patient    Theodoro Kalata 12/22/2021, 10:34 PM

## 2021-12-23 LAB — CBC
HCT: 36.5 % (ref 36.0–46.0)
Hemoglobin: 11.2 g/dL — ABNORMAL LOW (ref 12.0–15.0)
MCH: 25.9 pg — ABNORMAL LOW (ref 26.0–34.0)
MCHC: 30.7 g/dL (ref 30.0–36.0)
MCV: 84.3 fL (ref 80.0–100.0)
Platelets: 259 10*3/uL (ref 150–400)
RBC: 4.33 MIL/uL (ref 3.87–5.11)
RDW: 16.4 % — ABNORMAL HIGH (ref 11.5–15.5)
WBC: 11 10*3/uL — ABNORMAL HIGH (ref 4.0–10.5)
nRBC: 0 % (ref 0.0–0.2)

## 2021-12-23 MED ORDER — IBUPROFEN 600 MG PO TABS: 600.0000 mg | ORAL_TABLET | Freq: Four times a day (QID) | ORAL | 0 refills | Status: AC

## 2021-12-23 NOTE — Discharge Summary (Signed)
Postpartum Discharge Summary  Patient Name: Stephanie Cooley DOB: 01-Jun-1995 MRN: [id number]  Date of admission: 12/22/2021 Delivery date:12/22/2021  Delivering provider: Crawford Givens  Date of discharge: 12/23/2021  Admitting diagnosis: Post-dates pregnancy [O48.0], labor. Intrauterine pregnancy: [redacted]w[redacted]d     Secondary diagnosis:  Active Problems:   Post-dates pregnancy     Discharge diagnosis: Term Pregnancy Delivered                                              Post partum procedures: None Augmentation: N/A Complications: None  Hospital course: Onset of Labor With Vaginal Delivery      26 y.o. yo [id number] at [redacted]w[redacted]d was admitted in Active Labor on 12/22/2021. Patient had an uncomplicated labor course as follows:  Membrane Rupture Time/Date: 11:09 AM ,12/22/2021   Delivery Method:Vaginal, Spontaneous  Episiotomy: None  Lacerations:  2nd degree;Perineal  Patient had an uncomplicated postpartum course.  She is ambulating, tolerating a regular diet, passing flatus, and urinating well. Patient is discharged home in stable condition on 12/23/21.  Newborn Data: Birth date:12/22/2021  Birth time:2:25 PM  Gender:Female  Living status:Living  Apgars:8 ,9  Weight:3799 g   Physical exam  Vitals:   12/22/21 2140 12/23/21 0132 12/23/21 0440 12/23/21 0615  BP: 113/64 120/67 108/80 124/70  Pulse: 88 86 96 96  Resp: 16 18 16 14   Temp: 98.4 F (36.9 C) 98.6 F (37 C) 97.7 F (36.5 C) 97.9 F (36.6 C)  TempSrc: Oral Oral Axillary Oral  SpO2: 99% 100% 99% 100%  Weight:      Height:       General: alert, cooperative, and no distress Lochia: appropriate Uterine Fundus: firm Incision: N/A DVT Evaluation: No evidence of DVT seen on physical exam. No significant calf/ankle edema. Labs: Lab Results  Component Value Date   WBC 11.0 (H) 12/23/2021   HGB 11.2 (L) 12/23/2021   HCT 36.5 12/23/2021   MCV 84.3 12/23/2021   PLT 259 12/23/2021      Latest Ref Rng & Units 09/09/2021     1:30 PM  CMP  Glucose 70 - 99 mg/dL 93   BUN 6 - 20 mg/dL 8   Creatinine 0.44 - 1.00 mg/dL 0.65   Sodium 135 - 145 mmol/L 136   Potassium 3.5 - 5.1 mmol/L 3.7   Chloride 98 - 111 mmol/L 105   CO2 22 - 32 mmol/L 22   Calcium 8.9 - 10.3 mg/dL 8.7   Total Protein 6.5 - 8.1 g/dL 6.9   Total Bilirubin 0.3 - 1.2 mg/dL 0.6   Alkaline Phos 38 - 126 U/L 90   AST 15 - 41 U/L 21   ALT 0 - 44 U/L 19    Edinburgh Score:    12/22/2021    9:40 PM  Edinburgh Postnatal Depression Scale Screening Tool  I have been able to laugh and see the funny side of things. 0  I have looked forward with enjoyment to things. 0  I have blamed myself unnecessarily when things went wrong. 0  I have been anxious or worried for no good reason. 0  I have felt scared or panicky for no good reason. 0  Things have been getting on top of me. 0  I have been so unhappy that I have had difficulty sleeping. 0  I have felt sad or miserable. 0  I have  been so unhappy that I have been crying. 0  The thought of harming myself has occurred to me. 0  Edinburgh Postnatal Depression Scale Total 0    Allergies as of 12/23/2021   No Known Allergies      Medication List     STOP taking these medications    MAGNESIUM PO   Methocarbamol 1000 MG Tabs       TAKE these medications    acetaminophen 500 MG tablet Commonly known as: TYLENOL Take 2 tablets (1,000 mg total) by mouth every 6 (six) hours as needed.   calcium carbonate 500 MG chewable tablet Commonly known as: TUMS - dosed in mg elemental calcium Chew 500-1,000 mg by mouth daily as needed for indigestion or heartburn.   docusate sodium 100 MG capsule Commonly known as: COLACE Take 1 capsule (100 mg total) by mouth 2 (two) times daily as needed for mild constipation.   famotidine 20 MG tablet Commonly known as: PEPCID Take 20 mg by mouth daily as needed for heartburn or indigestion.   ibuprofen 600 MG tablet Commonly known as: ADVIL Take 1 tablet (600  mg total) by mouth every 6 (six) hours.   polyethylene glycol 17 g packet Commonly known as: MIRALAX / GLYCOLAX Take 17 g by mouth daily as needed for moderate constipation.   prenatal multivitamin Tabs tablet Take 1 tablet by mouth at bedtime.         Discharge home in stable condition Infant Feeding: Breast Infant Disposition:home with mother Discharge instruction: per After Visit Summary and Postpartum booklet. Activity: Advance as tolerated. Pelvic rest for 6 weeks.  Diet: routine diet Future Appointments:No future appointments. Follow up Visit:  Follow-up Information     Ob/Gyn, [facility]. Schedule an appointment as soon as possible for a visit in 6 week(s).   Specialty: Obstetrics and Gynecology Contact information: [address] [place] [address] [phone]               Anticipated Birth Control:  POPs  12/23/2021 Prescilla Sours, MD

## 2021-12-23 NOTE — Anesthesia Postprocedure Evaluation (Signed)
Anesthesia Post Note  Patient: Stephanie Cooley  Procedure(s) Performed: AN AD [place]     Patient location during evaluation: Mother Baby Anesthesia Type: Epidural Level of consciousness: awake and alert Pain management: pain level controlled Vital Signs Assessment: post-procedure vital signs reviewed and stable Respiratory status: spontaneous breathing, nonlabored ventilation and respiratory function stable Cardiovascular status: stable Postop Assessment: no headache, no backache and epidural receding Anesthetic complications: no   No notable events documented.  Last Vitals:  Vitals:   12/23/21 0440 12/23/21 0615  BP: 108/80 124/70  Pulse: 96 96  Resp: 16 14  Temp: 36.5 C 36.6 C  SpO2: 99% 100%    Last Pain:  Vitals:   12/23/21 0615  TempSrc: Oral  PainSc:    Pain Goal: Patients Stated Pain Goal: 0 (12/22/21 0217)                 Gilmer Mor

## 2021-12-23 NOTE — Lactation Note (Signed)
This note was copied from a baby's chart. Lactation Consultation Note  Patient Name: Stephanie Cooley [id number]'[id number] Date: 12/23/2021   Age:27 hours  RN requested [place] assist with breastfeeding as baby was ready.  Mom with sore nipples.  Coconut oil provided with instructions.  Currently Mom eating breakfast and baby swaddled sleeping in her crib.  Recommended placing baby STS to encourage feedings.  Encouraged Mom to call for lactation when baby starts cueing.    Broadus John 12/23/2021, 9:05 AM

## 2021-12-24 LAB — SURGICAL PATHOLOGY

## 2021-12-25 ENCOUNTER — Inpatient Hospital Stay (HOSPITAL_COMMUNITY): Payer: 59

## 2021-12-25 ENCOUNTER — Inpatient Hospital Stay (HOSPITAL_COMMUNITY): Admission: AD | Admit: 2021-12-25 | Payer: 59 | Source: Home / Self Care | Admitting: Obstetrics and Gynecology

## 2021-12-31 ENCOUNTER — Telehealth (HOSPITAL_COMMUNITY): Payer: Self-pay | Admitting: *Deleted

## 2021-12-31 NOTE — Telephone Encounter (Signed)
Mom reports feeling good. No concerns about herself at this time. EPDS not completed as mom is in the car, but says she is feeling well emotionally. [facility] epds score=0) Mom reports baby is doing well. Feeding, peeing, and pooping without difficulty. Safe sleep reviewed. Mom reports no concerns about baby at present.  Odis Hollingshead, RN 12-31-2021 at 10:52am

## 2022-01-10 DIAGNOSIS — Z00111 Health examination for newborn 8 to 28 days old: Secondary | ICD-10-CM | POA: Diagnosis not present

## 2022-01-21 DIAGNOSIS — Z00129 Encounter for routine child health examination without abnormal findings: Secondary | ICD-10-CM | POA: Diagnosis not present

## 2022-02-10 DIAGNOSIS — M9901 Segmental and somatic dysfunction of cervical region: Secondary | ICD-10-CM | POA: Diagnosis not present

## 2022-02-10 DIAGNOSIS — M9904 Segmental and somatic dysfunction of sacral region: Secondary | ICD-10-CM | POA: Diagnosis not present

## 2022-02-10 DIAGNOSIS — M5383 Other specified dorsopathies, cervicothoracic region: Secondary | ICD-10-CM | POA: Diagnosis not present

## 2022-02-10 DIAGNOSIS — M9902 Segmental and somatic dysfunction of thoracic region: Secondary | ICD-10-CM | POA: Diagnosis not present

## 2022-02-10 DIAGNOSIS — M9905 Segmental and somatic dysfunction of pelvic region: Secondary | ICD-10-CM | POA: Diagnosis not present

## 2022-02-10 DIAGNOSIS — M9903 Segmental and somatic dysfunction of lumbar region: Secondary | ICD-10-CM | POA: Diagnosis not present

## 2022-02-10 DIAGNOSIS — M7918 Myalgia, other site: Secondary | ICD-10-CM | POA: Diagnosis not present

## 2022-02-17 DIAGNOSIS — M9903 Segmental and somatic dysfunction of lumbar region: Secondary | ICD-10-CM | POA: Diagnosis not present

## 2022-02-17 DIAGNOSIS — M7918 Myalgia, other site: Secondary | ICD-10-CM | POA: Diagnosis not present

## 2022-02-17 DIAGNOSIS — M9901 Segmental and somatic dysfunction of cervical region: Secondary | ICD-10-CM | POA: Diagnosis not present

## 2022-02-17 DIAGNOSIS — M9904 Segmental and somatic dysfunction of sacral region: Secondary | ICD-10-CM | POA: Diagnosis not present

## 2022-02-17 DIAGNOSIS — M5383 Other specified dorsopathies, cervicothoracic region: Secondary | ICD-10-CM | POA: Diagnosis not present

## 2022-02-17 DIAGNOSIS — M9905 Segmental and somatic dysfunction of pelvic region: Secondary | ICD-10-CM | POA: Diagnosis not present

## 2022-02-17 DIAGNOSIS — M9902 Segmental and somatic dysfunction of thoracic region: Secondary | ICD-10-CM | POA: Diagnosis not present

## 2022-02-26 DIAGNOSIS — K130 Diseases of lips: Secondary | ICD-10-CM | POA: Diagnosis not present

## 2022-03-06 DIAGNOSIS — M9905 Segmental and somatic dysfunction of pelvic region: Secondary | ICD-10-CM | POA: Diagnosis not present

## 2022-03-06 DIAGNOSIS — M9904 Segmental and somatic dysfunction of sacral region: Secondary | ICD-10-CM | POA: Diagnosis not present

## 2022-03-06 DIAGNOSIS — M7918 Myalgia, other site: Secondary | ICD-10-CM | POA: Diagnosis not present

## 2022-03-06 DIAGNOSIS — M5383 Other specified dorsopathies, cervicothoracic region: Secondary | ICD-10-CM | POA: Diagnosis not present

## 2022-03-06 DIAGNOSIS — M9901 Segmental and somatic dysfunction of cervical region: Secondary | ICD-10-CM | POA: Diagnosis not present

## 2022-03-06 DIAGNOSIS — M9903 Segmental and somatic dysfunction of lumbar region: Secondary | ICD-10-CM | POA: Diagnosis not present

## 2022-03-06 DIAGNOSIS — M9902 Segmental and somatic dysfunction of thoracic region: Secondary | ICD-10-CM | POA: Diagnosis not present

## 2022-03-20 DIAGNOSIS — M5383 Other specified dorsopathies, cervicothoracic region: Secondary | ICD-10-CM | POA: Diagnosis not present

## 2022-03-20 DIAGNOSIS — M9904 Segmental and somatic dysfunction of sacral region: Secondary | ICD-10-CM | POA: Diagnosis not present

## 2022-03-20 DIAGNOSIS — M7918 Myalgia, other site: Secondary | ICD-10-CM | POA: Diagnosis not present

## 2022-03-20 DIAGNOSIS — M9902 Segmental and somatic dysfunction of thoracic region: Secondary | ICD-10-CM | POA: Diagnosis not present

## 2022-03-20 DIAGNOSIS — M9905 Segmental and somatic dysfunction of pelvic region: Secondary | ICD-10-CM | POA: Diagnosis not present

## 2022-03-20 DIAGNOSIS — M9903 Segmental and somatic dysfunction of lumbar region: Secondary | ICD-10-CM | POA: Diagnosis not present

## 2022-03-20 DIAGNOSIS — M9901 Segmental and somatic dysfunction of cervical region: Secondary | ICD-10-CM | POA: Diagnosis not present

## 2022-03-27 DIAGNOSIS — N946 Dysmenorrhea, unspecified: Secondary | ICD-10-CM | POA: Diagnosis not present

## 2022-03-27 DIAGNOSIS — Z3040 Encounter for surveillance of contraceptives, unspecified: Secondary | ICD-10-CM | POA: Diagnosis not present

## 2023-12-27 IMAGING — MR MR PELVIS W/O CM
13 of 15 series · 38 of 48 positions shown · non-contrast
Comparison: Abdominal sonogram which was performed on September 09, 2021.

CLINICAL DATA: Upper abdominal pain with nausea vomiting.

EXAM:
MRI ABDOMEN AND PELVIS WITHOUT CONTRAST
TECHNIQUE: Multiplanar multisequence MR imaging was performed of the abdomen
and pelvis without the administration of intravenous contrast.

[Series 3: cor haste · coronal · 5.0mm · 1.17mm/px · 2 of 36 slices shown]
[im 1/36]
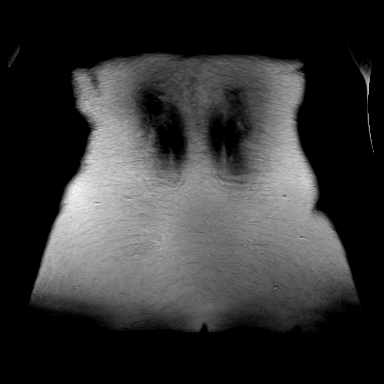
[im 36/36]
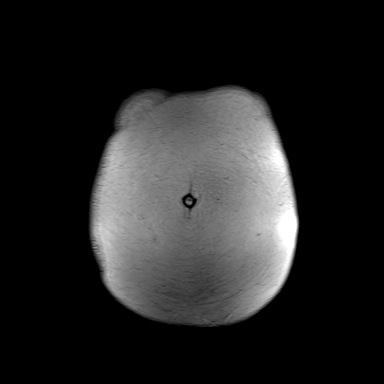

[Series 6: cor haste fs · coronal · 5.0mm · 1.30mm/px · 2 of 36 slices shown]
[im 1/36]
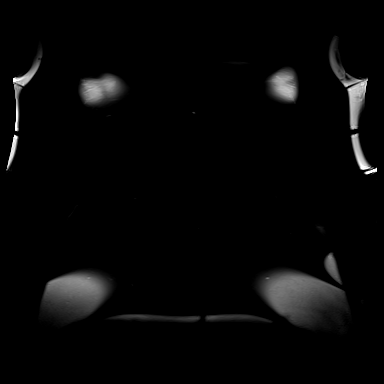
[im 36/36]
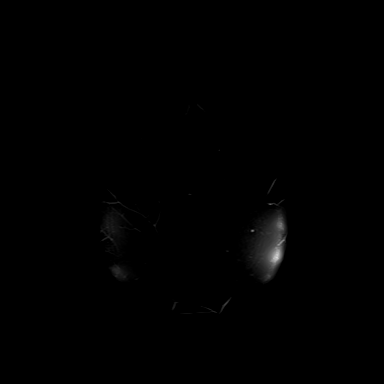

[Series 7: bSSFP · coronal · 5.0mm · 2.23mm/px · 2 of 36 slices shown (1 of 3)]
[im 1/36]
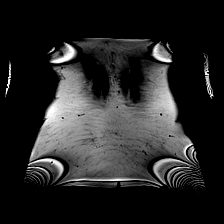
[im 36/36]
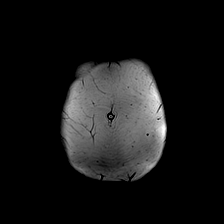

[Series 12: ax haste_comp · axial · 5.0mm · 1.25mm/px · z∈[-209,+7]mm · 3 of 37 slices shown (1 of 2)]
[im 1/37]
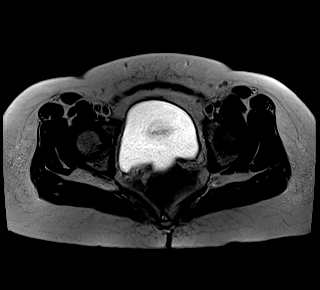
[im 19/37]
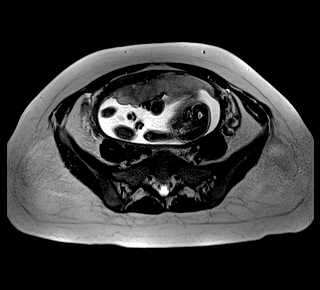
[im 37/37]
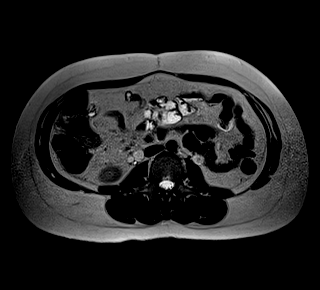

[Series 12: ax haste_comp · axial · 5.0mm · 1.04mm/px · z∈[+13,+229]mm · 3 of 37 slices shown (2 of 2)]
[im 1/37]
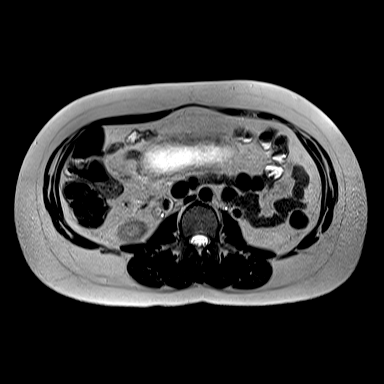
[im 19/37]
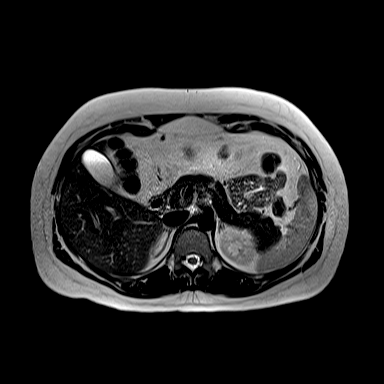
[im 37/37]
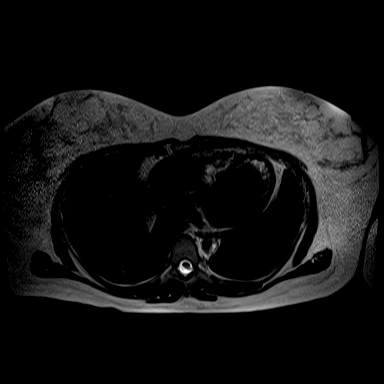

[Series 17: ax haste fs_comp · axial · 5.2mm · 1.25mm/px · z∈[-214,+4]mm · 3 of 36 slices shown (1 of 2)]
[im 1/36]
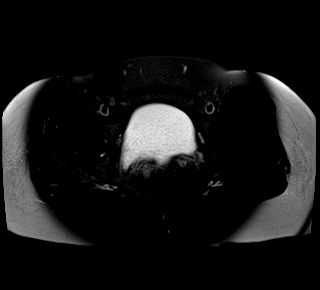
[im 18/36]
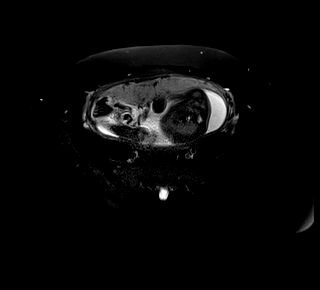
[im 36/36]
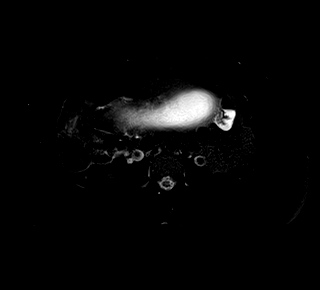

[Series 17: ax haste fs_comp · axial · 5.2mm · 1.04mm/px · z∈[+16,+234]mm · 3 of 36 slices shown (2 of 2)]
[im 1/36]
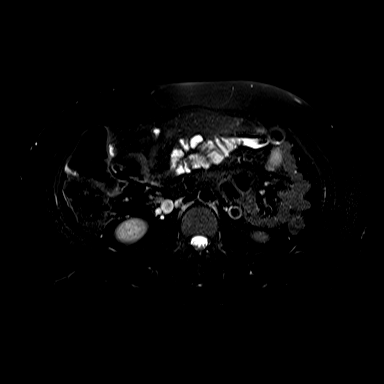
[im 18/36]
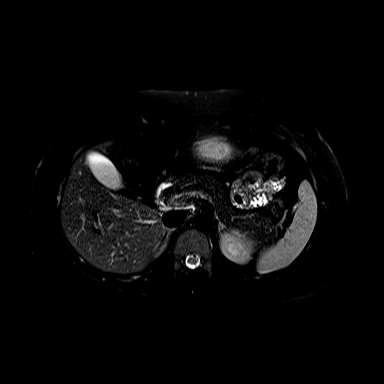
[im 36/36]
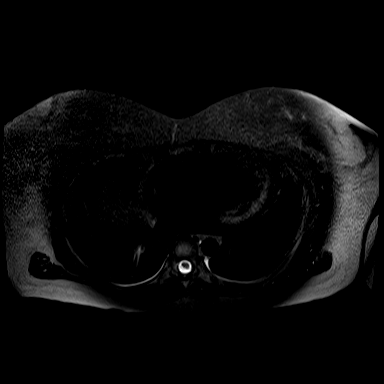

[Series 26: bSSFP · axial · 5.0mm · 1.79mm/px · z∈[+13,+229]mm · 3 of 37 slices shown (2 of 3)]
[im 1/37]
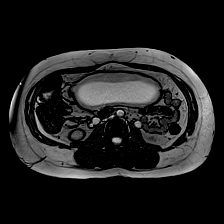
[im 19/37]
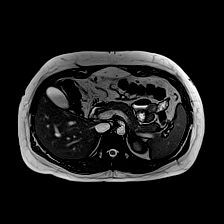
[im 37/37]
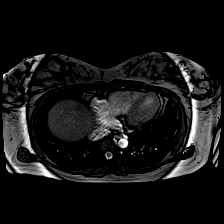

[Series 26: bSSFP · axial · 5.0mm · 0.78mm/px · z∈[-209,+7]mm · 3 of 37 slices shown (3 of 3)]
[im 1/37]
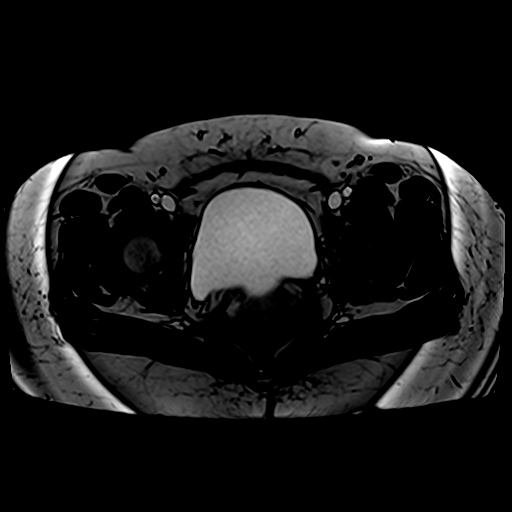
[im 19/37]
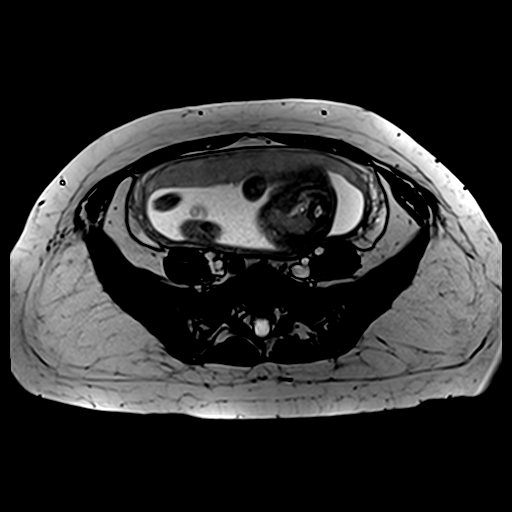
[im 37/37]
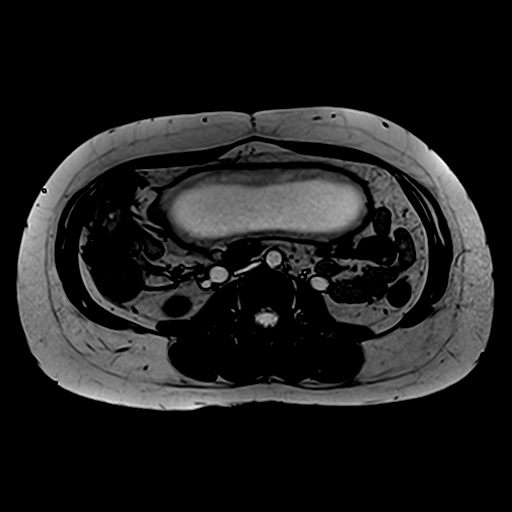

[Series 29: T1 · axial · 6.0mm · 1.41mm/px · z∈[-216,+94]mm · 3 of 44 slices shown (1 of 2)]
[im 1/44]
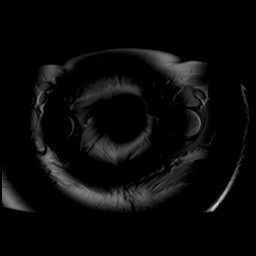
[im 22/44]
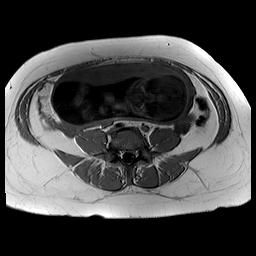
[im 44/44]
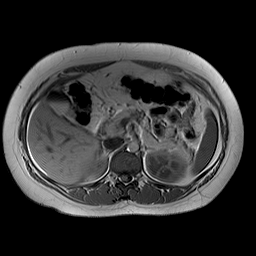

[Series 29: T1 · axial · 3.0mm · 0.70mm/px · z∈[+96,+229]mm · 3 of 38 slices shown (2 of 2)]
[im 1/38]
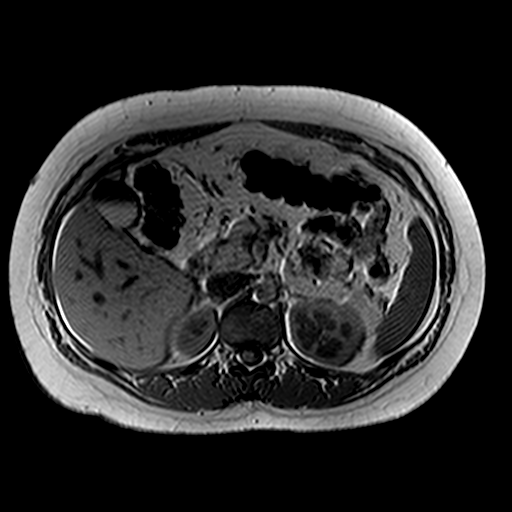
[im 19/38]
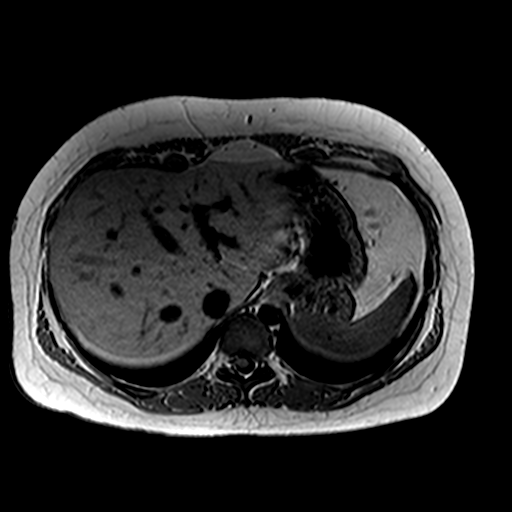
[im 38/38]
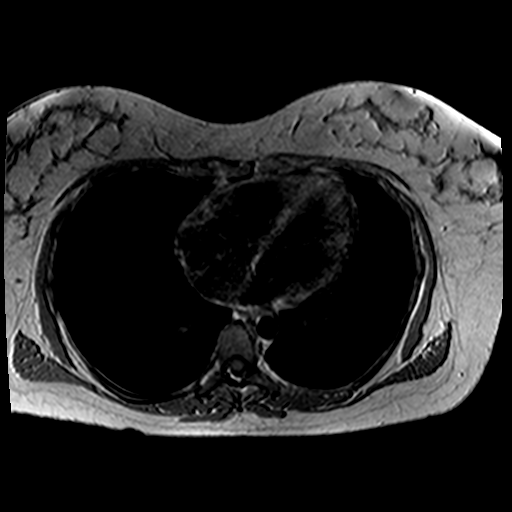

[Series 30: T1 dynamic · axial · 3.0mm · 1.41mm/px · z∈[-31,+230]mm · 6 of 88 slices shown (1 of 2)]
[im 1/88]
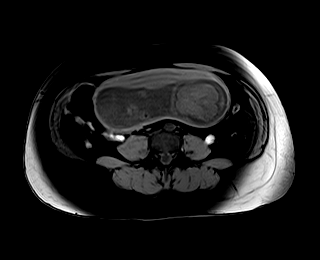
[im 18/88]
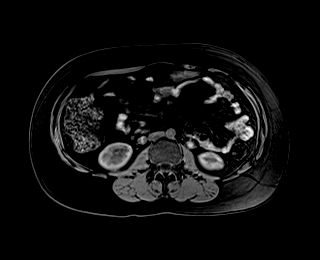
[im 35/88]
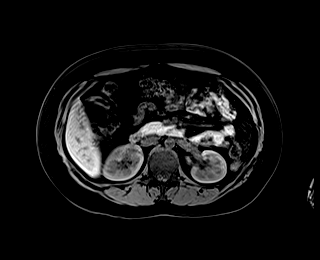
[im 53/88]
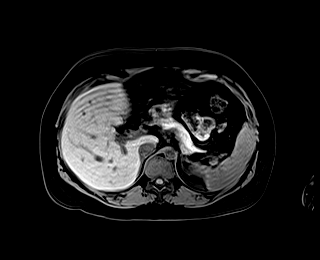
[im 70/88]
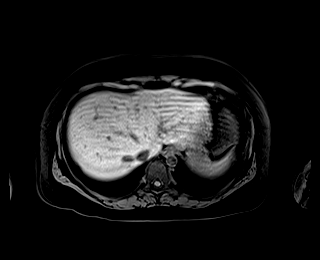
[im 88/88]
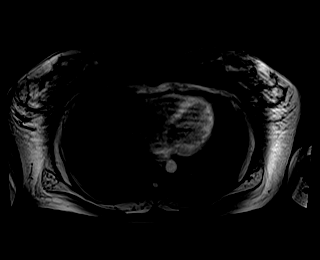

[Series 31: T1 dynamic · axial · 3.0mm · 1.41mm/px · z∈[-211,-160]mm · 2 of 88 slices shown (2 of 2)]
[im 1/88]
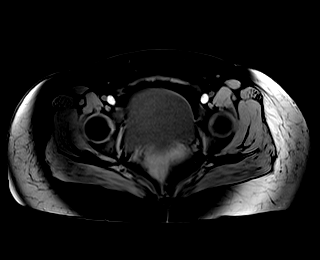
[im 18/88]
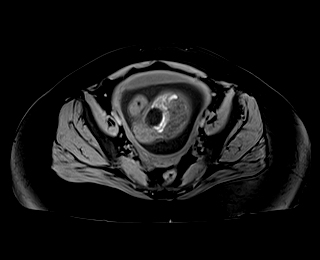

[38 of 48 positions shown; findings below may reference images not displayed]

FINDINGS: Lower chest: Incidental imaging of the lung bases on very limited
assessment is unremarkable.

Hepatobiliary: Liver with smooth contours. No pericholecystic
stranding. Sludge in the gallbladder. No biliary duct dilation.

Pancreas: Normal contour of the pancreas. Normal intrinsic T1 signal
within pancreatic parenchyma.

Spleen:  Normal size and contour.

Adrenals/Urinary Tract:  Adrenal glands are normal.

No hydronephrosis or perinephric stranding. Urinary bladder with
smooth contours to the extent evaluated, displaced inferiorly by the
gravid uterus.

Stomach/Bowel: Dilated appendix with edema in the wall. Appendix
with periappendiceal stranding and edema within the periappendiceal
fat, appendix measuring 9 mm greatest caliber. Trace fluid and/or
edema in the RIGHT lower quadrant. No additional acute bowel process
to the extent evaluated.

Vascular/Lymphatic: Not well evaluated in the absence of contrast.
Normal caliber vessels. No adenopathy in the abdomen or pelvis.

Other: Trace fluid in the RIGHT lower quadrant. No gross ascites. No
abscess.

Reproductive: Normal appearance of the bilateral ovaries. Gravid
uterus with fetus in breech presentation. Study not performed for
fetal evaluation.

Musculoskeletal: No suspicious bone lesions identified.
IMPRESSION: 1. Acute appendicitis.
2. While the study is not performed for fetal evaluation there is
incidental note made of breech presentation of the fetus at this
time with anterior placenta.

The presence of acute appendicitis was discussed with the provider
as outlined below.

These results were called by telephone at the time of interpretation
on 09/09/2021 at [DATE] to provider Katerinne Nidia Nachon, who verbally
acknowledged these results.
